# Patient Record
Sex: Female | Born: 1935 | Hispanic: No | Smoking: Former smoker
Health system: Southern US, Community
[De-identification: ages and names within clinical notes are randomized; demographics above are authoritative.]

## PROBLEM LIST (undated history)

## (undated) DIAGNOSIS — H409 Unspecified glaucoma: Secondary | ICD-10-CM

## (undated) DIAGNOSIS — H02402 Unspecified ptosis of left eyelid: Secondary | ICD-10-CM

## (undated) DIAGNOSIS — H04123 Dry eye syndrome of bilateral lacrimal glands: Secondary | ICD-10-CM

## (undated) DIAGNOSIS — I10 Essential (primary) hypertension: Secondary | ICD-10-CM

## (undated) HISTORY — DX: Dry eye syndrome of bilateral lacrimal glands: H04.123

## (undated) HISTORY — DX: Unspecified ptosis of left eyelid: H02.402

## (undated) HISTORY — PX: ABDOMINAL HYSTERECTOMY: SHX81

## (undated) HISTORY — PX: CATARACT EXTRACTION: SUR2

---

## 2010-03-05 ENCOUNTER — Encounter: Admission: RE | Admit: 2010-03-05 | Discharge: 2010-03-05 | Payer: Self-pay | Admitting: Endocrinology

## 2010-04-02 ENCOUNTER — Encounter: Admission: RE | Admit: 2010-04-02 | Discharge: 2010-04-02 | Payer: Self-pay | Admitting: Endocrinology

## 2011-03-19 ENCOUNTER — Ambulatory Visit
Admission: RE | Admit: 2011-03-19 | Discharge: 2011-03-19 | Disposition: A | Discharge status: Home / Self Care | Payer: Medicare Other | Source: Ambulatory Visit | Attending: Endocrinology | Admitting: Endocrinology

## 2011-03-19 ENCOUNTER — Other Ambulatory Visit: Payer: Self-pay | Admitting: Endocrinology

## 2011-03-19 DIAGNOSIS — Z0000 Encounter for general adult medical examination without abnormal findings: Secondary | ICD-10-CM

## 2011-09-16 ENCOUNTER — Ambulatory Visit
Admission: RE | Admit: 2011-09-16 | Discharge: 2011-09-16 | Disposition: A | Discharge status: Home / Self Care | Payer: Medicare Other | Source: Ambulatory Visit | Attending: Endocrinology | Admitting: Endocrinology

## 2011-09-16 ENCOUNTER — Other Ambulatory Visit: Payer: Self-pay | Admitting: Endocrinology

## 2011-09-16 DIAGNOSIS — Z0000 Encounter for general adult medical examination without abnormal findings: Secondary | ICD-10-CM

## 2013-11-19 ENCOUNTER — Emergency Department (INDEPENDENT_AMBULATORY_CARE_PROVIDER_SITE_OTHER)
Admission: EM | Admit: 2013-11-19 | Discharge: 2013-11-19 | Disposition: A | Discharge status: Home / Self Care | Payer: Medicare Other | Source: Home / Self Care | Attending: Family Medicine | Admitting: Family Medicine

## 2013-11-19 ENCOUNTER — Encounter (HOSPITAL_COMMUNITY): Payer: Self-pay | Admitting: Emergency Medicine

## 2013-11-19 DIAGNOSIS — J329 Chronic sinusitis, unspecified: Secondary | ICD-10-CM

## 2013-11-19 DIAGNOSIS — R0982 Postnasal drip: Secondary | ICD-10-CM

## 2013-11-19 LAB — POCT RAPID STREP A: Streptococcus, Group A Screen (Direct): NEGATIVE

## 2013-11-19 MED ORDER — IPRATROPIUM BROMIDE 0.06 % NA SOLN: 2.0000 | Freq: Four times a day (QID) | NASAL | Status: DC

## 2013-11-19 NOTE — ED Notes (Signed)
Pt c/o ST onset 1.5 weeks w/sxs that include a productive cough and fever Denies: v/n/d, SOB, wheezing She is alert w/no signs of acute distress.

## 2013-11-19 NOTE — ED Provider Notes (Signed)
Medical screening examination/treatment/procedure(s) were performed by resident physician or non-physician practitioner and as supervising physician I was immediately available for consultation/collaboration.   Barkley Bruns MD.   Linna Hoff, MD 11/19/13 [phone]

## 2013-11-19 NOTE — ED Provider Notes (Signed)
CSN: [id number]     Arrival date & time 11/19/13  0900 History   First MD Initiated Contact with Patient 11/19/13 0919     Chief Complaint  Patient presents with  . Sore Throat   (Consider location/radiation/quality/duration/timing/severity/associated sxs/prior Treatment) HPI Comments: Patient reports she is recovering from a recent URI, however, she still has some post nasal drainage that is causing some throat irritation and she would like to have this evaluated. Denies fever. States occasionally the drainage makes her cough. Denies dyspnea.   The history is provided by the patient.    History reviewed. No pertinent past medical history. History reviewed. No pertinent past surgical history. No family history on file. History  Substance Use Topics  . Smoking status: Never Smoker   . Smokeless tobacco: Not on file  . Alcohol Use: No   OB History   Grav Para Term Preterm Abortions TAB SAB Ect Mult Living                 Review of Systems  All other systems reviewed and are negative.    Allergies  Review of patient's allergies indicates no known allergies.  Home Medications  No current outpatient prescriptions on file. BP 151/84  Pulse 87  Temp(Src) 97.7 F (36.5 C) (Oral)  Resp 18  SpO2 97% Physical Exam  Nursing note and vitals reviewed. Constitutional: She is oriented to person, place, and time. She appears well-developed and well-nourished. No distress.  HENT:  Head: Normocephalic and atraumatic.  Right Ear: Hearing, tympanic membrane, external ear and ear canal normal.  Left Ear: Hearing, tympanic membrane, external ear and ear canal normal.  Nose: Nose normal.  Mouth/Throat: Uvula is midline, oropharynx is clear and moist and mucous membranes are normal.  Eyes: Conjunctivae are normal. Pupils are equal, round, and reactive to light. Right eye exhibits no discharge. Left eye exhibits no discharge. No scleral icterus.  Neck: Normal range of motion. Neck supple.   Cardiovascular: Normal rate, regular rhythm and normal heart sounds.   Pulmonary/Chest: Effort normal and breath sounds normal. No respiratory distress.  Abdominal: Soft. There is no tenderness.  Musculoskeletal: Normal range of motion.  Lymphadenopathy:    She has no cervical adenopathy.  Neurological: She is alert and oriented to person, place, and time.  Skin: Skin is warm and dry.  Psychiatric: She has a normal mood and affect. Her behavior is normal.    ED Course  Procedures (including critical care time) Labs Review Labs Reviewed - No data to display Imaging Review No results found.  EKG Interpretation    Date/Time:    Ventricular Rate:    PR Interval:    QRS Duration:   QT Interval:    QTC Calculation:   R Axis:     Text Interpretation:              MDM  Benign exam. Will prescribe atrovent nasal spray and advise PCP (Dr. Leslie Dales) follow up if no improvement.     Jess Barters [place], [place] 11/19/13 [phone]

## 2013-11-21 LAB — CULTURE, GROUP A STREP

## 2015-05-05 ENCOUNTER — Emergency Department (INDEPENDENT_AMBULATORY_CARE_PROVIDER_SITE_OTHER)
Admission: EM | Admit: 2015-05-05 | Discharge: 2015-05-05 | Disposition: A | Discharge status: Home / Self Care | Payer: Medicare Other | Source: Home / Self Care | Attending: Family Medicine | Admitting: Family Medicine

## 2015-05-05 ENCOUNTER — Encounter (HOSPITAL_COMMUNITY): Payer: Self-pay | Admitting: Emergency Medicine

## 2015-05-05 DIAGNOSIS — L03113 Cellulitis of right upper limb: Secondary | ICD-10-CM

## 2015-05-05 MED ORDER — LIDOCAINE HCL (PF) 1 % IJ SOLN
INTRAMUSCULAR | Status: AC
  Filled 2015-05-05: qty 5

## 2015-05-05 MED ORDER — CEPHALEXIN 500 MG PO CAPS: 500.0000 mg | ORAL_CAPSULE | Freq: Three times a day (TID) | ORAL | Status: DC

## 2015-05-05 MED ORDER — CEFTRIAXONE SODIUM 1 G IJ SOLR
INTRAMUSCULAR | Status: AC
  Filled 2015-05-05: qty 10

## 2015-05-05 MED ORDER — CEFTRIAXONE SODIUM 1 G IJ SOLR
1.0000 g | Freq: Once | INTRAMUSCULAR | Status: AC
  Administered 2015-05-05: 1 g via INTRAMUSCULAR

## 2015-05-05 NOTE — ED Provider Notes (Signed)
Sheila Spears is a 81 y.o. female who presents to Urgent Care today for cellulitis. Patient received a pneumococcal vaccine on June 7 on her right upper arm. She developed redness and pain the next day. The redness and pain is worsening. She developed a small fever this morning. No vomiting or diarrhea. No treatment tried yet.   History reviewed. No pertinent past medical history. History reviewed. No pertinent past surgical history. History  Substance Use Topics  . Smoking status: Never Smoker   . Smokeless tobacco: Not on file  . Alcohol Use: No   ROS as above Medications: No current facility-administered medications for this encounter.   Current Outpatient Prescriptions  Medication Sig Dispense Refill  . cephALEXin (KEFLEX) 500 MG capsule Take 1 capsule (500 mg total) by mouth 3 (three) times daily. 21 capsule 0  . diltiazem (DILACOR XR) 180 MG 24 hr capsule Take 180 mg by mouth daily.    . ferrous sulfate 325 (65 FE) MG tablet Take 325 mg by mouth daily with breakfast.    . ipratropium (ATROVENT) 0.06 % nasal spray Place 2 sprays into both nostrils 4 (four) times daily. 15 mL 1  . latanoprost (XALATAN) 0.005 % ophthalmic solution 1 drop at bedtime.    Marland Kitchen loratadine (CLARITIN) 10 MG tablet Take 10 mg by mouth daily.    . Multiple Vitamin (MULTIVITAMIN) capsule Take 1 capsule by mouth daily.    . valsartan (DIOVAN) 320 MG tablet Take 320 mg by mouth daily.     No Known Allergies   Exam:  BP 135/66 mmHg  Pulse 97  Temp(Src) 100.4 F (38 C) (Oral)  Resp 16  SpO2 95% Gen: Well NAD HEENT: EOMI,  MMM Lungs: Normal work of breathing. CTABL Heart: RRR no MRG Abd: NABS, Soft. Nondistended, Nontender Exts: Brisk capillary refill, warm and well perfused.  Skin: Large erythematous tender area right upper arm. No significant induration or fluctuance.      No results found for this or any previous visit (from the past 24 hour(s)). No results found.  Assessment and Plan: 81  y.o. female with cellulitis likely due to vaccine. Treat with 1 g ceftriaxone injection in clinic followed by oral Keflex. Prompt follow-up if not improving.  Discussed warning signs or symptoms. Please see discharge instructions. Patient expresses understanding.     Rodolph Bong, MD 05/05/15 [phone]

## 2015-05-05 NOTE — ED Notes (Signed)
C/o right swelling and red States she had a pneumonia shot on Tuesday and notice the swelling Wednesday  Alcohol was used as tx

## 2015-05-05 NOTE — Discharge Instructions (Signed)
Thank you for coming in today. Take Keflex 3 times a day starting tomorrow morning. If worsening or not improving go to the emergency room.  Cellulitis Cellulitis is an infection of the skin and the tissue beneath it. The infected area is usually red and tender. Cellulitis occurs most often in the arms and lower legs.  CAUSES  Cellulitis is caused by bacteria that enter the skin through cracks or cuts in the skin. The most common types of bacteria that cause cellulitis are staphylococci and streptococci. SIGNS AND SYMPTOMS   Redness and warmth.  Swelling.  Tenderness or pain.  Fever. DIAGNOSIS  Your health care provider can usually determine what is wrong based on a physical exam. Blood tests may also be done. TREATMENT  Treatment usually involves taking an antibiotic medicine. HOME CARE INSTRUCTIONS   Take your antibiotic medicine as directed by your health care provider. Finish the antibiotic even if you start to feel better.  Keep the infected arm or leg elevated to reduce swelling.  Apply a warm cloth to the affected area up to 4 times per day to relieve pain.  Take medicines only as directed by your health care provider.  Keep all follow-up visits as directed by your health care provider. SEEK MEDICAL CARE IF:   You notice red streaks coming from the infected area.  Your red area gets larger or turns dark in color.  Your bone or joint underneath the infected area becomes painful after the skin has healed.  Your infection returns in the same area or another area.  You notice a swollen bump in the infected area.  You develop new symptoms.  You have a fever. SEEK IMMEDIATE MEDICAL CARE IF:   You feel very sleepy.  You develop vomiting or diarrhea.  You have a general ill feeling (malaise) with muscle aches and pains. MAKE SURE YOU:   Understand these instructions.  Will watch your condition.  Will get help right away if you are not doing well or get  worse. Document Released: 08/20/2005 Document Revised: 03/27/2014 Document Reviewed: 01/26/2012 [facility] Patient Information 2015 [place], [place]. This information is not intended to replace advice given to you by your health care provider. Make sure you discuss any questions you have with your health care provider.

## 2015-05-07 ENCOUNTER — Telehealth (HOSPITAL_COMMUNITY): Payer: Self-pay | Admitting: Family Medicine

## 2015-05-07 NOTE — ED Notes (Signed)
I called Ms Crewse for check. She is feeling some better. The redness has spread a little. I recommend that she come in for a recheck. She says that she will.   Rodolph Bong, MD 05/07/15 [phone]

## 2015-05-08 ENCOUNTER — Encounter (HOSPITAL_COMMUNITY): Payer: Self-pay | Admitting: Emergency Medicine

## 2015-05-08 ENCOUNTER — Emergency Department (HOSPITAL_COMMUNITY)
Admission: EM | Admit: 2015-05-08 | Discharge: 2015-05-08 | Disposition: A | Discharge status: Nursing Home (Includes ICF) | Payer: Medicare Other | Source: Home / Self Care | Attending: Family Medicine | Admitting: Family Medicine

## 2015-05-08 ENCOUNTER — Inpatient Hospital Stay (HOSPITAL_COMMUNITY)
Admission: EM | Admit: 2015-05-08 | Discharge: 2015-05-10 | DRG: 603 | Disposition: A | Discharge status: Home / Self Care | Payer: Medicare Other | Attending: Internal Medicine | Admitting: Internal Medicine

## 2015-05-08 DIAGNOSIS — Z79899 Other long term (current) drug therapy: Secondary | ICD-10-CM | POA: Diagnosis not present

## 2015-05-08 DIAGNOSIS — L0390 Cellulitis, unspecified: Secondary | ICD-10-CM | POA: Diagnosis present

## 2015-05-08 DIAGNOSIS — D696 Thrombocytopenia, unspecified: Secondary | ICD-10-CM | POA: Diagnosis present

## 2015-05-08 DIAGNOSIS — I10 Essential (primary) hypertension: Secondary | ICD-10-CM | POA: Diagnosis present

## 2015-05-08 DIAGNOSIS — T50Z95A Adverse effect of other vaccines and biological substances, initial encounter: Secondary | ICD-10-CM | POA: Diagnosis present

## 2015-05-08 DIAGNOSIS — Z66 Do not resuscitate: Secondary | ICD-10-CM | POA: Diagnosis present

## 2015-05-08 DIAGNOSIS — L03113 Cellulitis of right upper limb: Secondary | ICD-10-CM

## 2015-05-08 DIAGNOSIS — L958 Other vasculitis limited to the skin: Secondary | ICD-10-CM | POA: Diagnosis present

## 2015-05-08 DIAGNOSIS — Z881 Allergy status to other antibiotic agents status: Secondary | ICD-10-CM

## 2015-05-08 DIAGNOSIS — L538 Other specified erythematous conditions: Secondary | ICD-10-CM | POA: Diagnosis present

## 2015-05-08 DIAGNOSIS — M542 Cervicalgia: Secondary | ICD-10-CM | POA: Diagnosis present

## 2015-05-08 DIAGNOSIS — T361X5A Adverse effect of cephalosporins and other beta-lactam antibiotics, initial encounter: Secondary | ICD-10-CM | POA: Diagnosis present

## 2015-05-08 DIAGNOSIS — H409 Unspecified glaucoma: Secondary | ICD-10-CM | POA: Diagnosis present

## 2015-05-08 DIAGNOSIS — G8929 Other chronic pain: Secondary | ICD-10-CM | POA: Diagnosis present

## 2015-05-08 DIAGNOSIS — R233 Spontaneous ecchymoses: Secondary | ICD-10-CM

## 2015-05-08 HISTORY — DX: Unspecified glaucoma: H40.9

## 2015-05-08 HISTORY — DX: Essential (primary) hypertension: I10

## 2015-05-08 LAB — BASIC METABOLIC PANEL
Anion gap: 10 (ref 5–15)
BUN: 18 mg/dL (ref 6–20)
CALCIUM: 9.6 mg/dL (ref 8.9–10.3)
CO2: 24 mmol/L (ref 22–32)
CREATININE: 0.79 mg/dL (ref 0.44–1.00)
Chloride: 102 mmol/L (ref 101–111)
Glucose, Bld: 122 mg/dL — ABNORMAL HIGH (ref 65–99)
POTASSIUM: 4.4 mmol/L (ref 3.5–5.1)
Sodium: 136 mmol/L (ref 135–145)

## 2015-05-08 LAB — CBC WITH DIFFERENTIAL/PLATELET
BASOS ABS: 0 10*3/uL (ref 0.0–0.1)
Basophils Relative: 0 % (ref 0–1)
EOS ABS: 0.1 10*3/uL (ref 0.0–0.7)
Eosinophils Relative: 1 % (ref 0–5)
HCT: 35 % — ABNORMAL LOW (ref 36.0–46.0)
Hemoglobin: 11.8 g/dL — ABNORMAL LOW (ref 12.0–15.0)
LYMPHS PCT: 36 % (ref 12–46)
Lymphs Abs: 2.3 10*3/uL (ref 0.7–4.0)
MCH: 28.7 pg (ref 26.0–34.0)
MCHC: 33.7 g/dL (ref 30.0–36.0)
MCV: 85.2 fL (ref 78.0–100.0)
MONO ABS: 0.4 10*3/uL (ref 0.1–1.0)
Monocytes Relative: 6 % (ref 3–12)
NEUTROS PCT: 57 % (ref 43–77)
Neutro Abs: 3.7 10*3/uL (ref 1.7–7.7)
Platelets: 144 10*3/uL — ABNORMAL LOW (ref 150–400)
RBC: 4.11 MIL/uL (ref 3.87–5.11)
RDW: 13.6 % (ref 11.5–15.5)
WBC: 6.5 10*3/uL (ref 4.0–10.5)

## 2015-05-08 LAB — URINALYSIS W MICROSCOPIC (NOT AT ARMC)
Bilirubin Urine: NEGATIVE
GLUCOSE, UA: NEGATIVE mg/dL
Hgb urine dipstick: NEGATIVE
Ketones, ur: NEGATIVE mg/dL
LEUKOCYTES UA: NEGATIVE
Nitrite: NEGATIVE
PROTEIN: NEGATIVE mg/dL
Specific Gravity, Urine: 1.012 (ref 1.005–1.030)
Urobilinogen, UA: 1 mg/dL (ref 0.0–1.0)
pH: 7.5 (ref 5.0–8.0)

## 2015-05-08 LAB — C-REACTIVE PROTEIN: CRP: 16.7 mg/dL — ABNORMAL HIGH (ref <1.0)

## 2015-05-08 LAB — TYPE AND SCREEN
ABO/RH(D): B POS
ANTIBODY SCREEN: NEGATIVE

## 2015-05-08 LAB — SEDIMENTATION RATE: Sed Rate: 89 mm/hr — ABNORMAL HIGH (ref 0–22)

## 2015-05-08 LAB — APTT: APTT: 30 s (ref 24–37)

## 2015-05-08 LAB — CK: CK TOTAL: 75 U/L (ref 38–234)

## 2015-05-08 LAB — I-STAT CG4 LACTIC ACID, ED
Lactic Acid, Venous: 1.3 mmol/L (ref 0.5–2.0)
Lactic Acid, Venous: 1.17 mmol/L (ref 0.5–2.0)

## 2015-05-08 LAB — LACTIC ACID, PLASMA: Lactic Acid, Venous: 1.8 mmol/L (ref 0.5–2.0)

## 2015-05-08 LAB — LACTATE DEHYDROGENASE: LDH: 202 U/L — ABNORMAL HIGH (ref 98–192)

## 2015-05-08 LAB — PROTIME-INR
INR: 1.18 (ref 0.00–1.49)
PROTHROMBIN TIME: 15.2 s (ref 11.6–15.2)

## 2015-05-08 LAB — FIBRINOGEN: FIBRINOGEN: 759 mg/dL — AB (ref 204–475)

## 2015-05-08 LAB — PROCALCITONIN

## 2015-05-08 MED ORDER — PIPERACILLIN-TAZOBACTAM 3.375 G IVPB 30 MIN: 3.3750 g | Freq: Once | INTRAVENOUS | Status: DC

## 2015-05-08 MED ORDER — VANCOMYCIN HCL 10 G IV SOLR
1250.0000 mg | INTRAVENOUS | Status: DC
  Filled 2015-05-08: qty 1250

## 2015-05-08 MED ORDER — SODIUM CHLORIDE 0.9 % IV SOLN
INTRAVENOUS | Status: DC
  Administered 2015-05-09: via INTRAVENOUS

## 2015-05-08 MED ORDER — ONDANSETRON HCL 4 MG PO TABS: 4.0000 mg | ORAL_TABLET | Freq: Four times a day (QID) | ORAL | Status: DC | PRN

## 2015-05-08 MED ORDER — VANCOMYCIN HCL 10 G IV SOLR
1250.0000 mg | INTRAVENOUS | Status: DC
  Administered 2015-05-09: 1250 mg via INTRAVENOUS
  Filled 2015-05-08 (×2): qty 1250

## 2015-05-08 MED ORDER — ACETAMINOPHEN 325 MG PO TABS: 650.0000 mg | ORAL_TABLET | Freq: Four times a day (QID) | ORAL | Status: DC | PRN

## 2015-05-08 MED ORDER — ONDANSETRON HCL 4 MG/2ML IJ SOLN: 4.0000 mg | Freq: Three times a day (TID) | INTRAMUSCULAR | Status: DC | PRN

## 2015-05-08 MED ORDER — FERROUS SULFATE 325 (65 FE) MG PO TABS
325.0000 mg | ORAL_TABLET | ORAL | Status: DC | PRN
  Filled 2015-05-08: qty 1

## 2015-05-08 MED ORDER — IRBESARTAN 300 MG PO TABS
300.0000 mg | ORAL_TABLET | Freq: Every day | ORAL | Status: DC
  Administered 2015-05-09 – 2015-05-10 (×2): 300 mg via ORAL
  Filled 2015-05-08 (×2): qty 1

## 2015-05-08 MED ORDER — ACETAMINOPHEN 650 MG RE SUPP
650.0000 mg | Freq: Four times a day (QID) | RECTAL | Status: DC | PRN
Start: 2015-05-08 — End: 2015-05-10

## 2015-05-08 MED ORDER — PIPERACILLIN-TAZOBACTAM 3.375 G IVPB: 3.3750 g | Freq: Three times a day (TID) | INTRAVENOUS | Status: DC

## 2015-05-08 MED ORDER — DILTIAZEM HCL ER 180 MG PO CP24
180.0000 mg | ORAL_CAPSULE | Freq: Every day | ORAL | Status: DC
  Administered 2015-05-09 – 2015-05-10 (×2): 180 mg via ORAL
  Filled 2015-05-08 (×2): qty 1

## 2015-05-08 MED ORDER — ONDANSETRON HCL 4 MG/2ML IJ SOLN: 4.0000 mg | Freq: Four times a day (QID) | INTRAMUSCULAR | Status: DC | PRN

## 2015-05-08 MED ORDER — VANCOMYCIN HCL 10 G IV SOLR
1250.0000 mg | Freq: Once | INTRAVENOUS | Status: AC
  Administered 2015-05-08: 1250 mg via INTRAVENOUS
  Filled 2015-05-08: qty 1250

## 2015-05-08 NOTE — ED Notes (Signed)
Dr. Allena Katz at bedside explaining to family reason for pt being admitted.

## 2015-05-08 NOTE — ED Notes (Signed)
Pt. Stated, I had a pneumonia shot last week and My arm is red and painful sometimes for a week.

## 2015-05-08 NOTE — Progress Notes (Signed)
Received report

## 2015-05-08 NOTE — ED Notes (Signed)
Here for a f/u of right arm; seen here on 6/11 Reports swelling and redness has spread and has now developed a rash on bilateral legs Denies SOB, dyspnea Alert, no signs of acute distress.

## 2015-05-08 NOTE — Progress Notes (Signed)
ANTIBIOTIC CONSULT NOTE - INITIAL  Pharmacy Consult for vancomycin, zosyn Indication: cellulitis  No Known Allergies  Patient Measurements: Height: 5\' 3"  (160 cm) Weight: 145 lb (65.772 kg) IBW/kg (Calculated) : 52.4 Adjusted Body Weight:   Vital Signs: Temp: 98 F (36.7 C) (06/14 1417) Temp Source: Oral (06/14 1316) BP: 132/64 mmHg (06/14 1730) Pulse Rate: 66 (06/14 1730) Intake/Output from previous day:   Intake/Output from this shift:    Labs:  Recent Labs  05/08/15 1648  WBC 6.5  HGB 11.8*  PLT 144*  CREATININE 0.79   Estimated Creatinine Clearance: 42.6 mL/min (by C-G formula based on Cr of 0.79). No results for input(s): VANCOTROUGH, VANCOPEAK, VANCORANDOM, GENTTROUGH, GENTPEAK, GENTRANDOM, TOBRATROUGH, TOBRAPEAK, TOBRARND, AMIKACINPEAK, AMIKACINTROU, AMIKACIN in the last 72 hours.   Microbiology: No results found for this or any previous visit (from the past 720 hour(s)).  Medical History: Past Medical History  Diagnosis Date  . Hypertension     Medications:  See EMR  Assessment: 81 yo female admitted with cellulitis on bilateral lower extremities. Pt was started on Keflex 3 days ago with no improvement in symptoms. WBC wnl, afebrile.   Goal of Therapy:  Vancomycin trough level 10-15 mcg/ml   Plan:  Vancomycin 1250 mg IV q24h Zosyn 3.375 g IV q8h Monitor renal fx, cultures, VT as needed    Agapito Games, PharmD, BCPS Clinical Pharmacist Pager: [phone] 05/08/2015 7:00 PM

## 2015-05-08 NOTE — ED Provider Notes (Signed)
Sheila Spears is a 81 y.o. female who presents to Urgent Care today for cellulitis. Patient was seen on the 11th after developing cellulitis following an injection of pneumococcal vaccine in her right arm. She was given a ceftriaxone injection and started on oral Keflex. She notes that the pain and fever in her arm has slightly diminished however the redness has extended distally down her arm. She notes continued tenderness now especially at the medial elbow on the right. Additionally last night she developed a rash on her bilateral lower extremities. No fevers or chills vomiting or diarrhea. No shortness of breath.   History reviewed. No pertinent past medical history. History reviewed. No pertinent past surgical history. History  Substance Use Topics  . Smoking status: Never Smoker   . Smokeless tobacco: Not on file  . Alcohol Use: No   ROS as above Medications: No current facility-administered medications for this encounter.   Current Outpatient Prescriptions  Medication Sig Dispense Refill  . cephALEXin (KEFLEX) 500 MG capsule Take 1 capsule (500 mg total) by mouth 3 (three) times daily. 21 capsule 0  . diltiazem (DILACOR XR) 180 MG 24 hr capsule Take 180 mg by mouth daily.    . ferrous sulfate 325 (65 FE) MG tablet Take 325 mg by mouth daily with breakfast.    . loratadine (CLARITIN) 10 MG tablet Take 10 mg by mouth daily.    . Multiple Vitamin (MULTIVITAMIN) capsule Take 1 capsule by mouth daily.    . valsartan (DIOVAN) 320 MG tablet Take 320 mg by mouth daily.    Marland Kitchen ipratropium (ATROVENT) 0.06 % nasal spray Place 2 sprays into both nostrils 4 (four) times daily. 15 mL 1  . latanoprost (XALATAN) 0.005 % ophthalmic solution 1 drop at bedtime.     No Known Allergies   Exam:  BP 128/59 mmHg  Pulse 99  Temp(Src) 98.3 F (36.8 C) (Oral)  Resp 16  SpO2 98% Gen: Well NAD HEENT: EOMI,  MMM Skin: Erythema extending distally beyond the borders of the original cellulitis. The  proximal portion of the cellulitis is resolving. She is tender without fluctuance at the medial elbow. Additionally she has a  petechial rash on her bilateral lower extremities. Nonblanching. Nontender. Exts: Brisk capillary refill, warm and well perfused.   No results found for this or any previous visit (from the past 24 hour(s)). No results found.  Assessment and Plan: 81 y.o. female with  1) cellulitis: Worsening despite adequate oral antibody. Transfer to the emergency department for further evaluation and management. 2) petechial rash: Concerning for DIC or thrombocytopenia. Transfer to the emergency room for further evaluation and management.  Discussed warning signs or symptoms. Please see discharge instructions. Patient expresses understanding.     Rodolph Bong, MD 05/08/15 [phone]

## 2015-05-08 NOTE — ED Provider Notes (Signed)
CSN: [id number]     Arrival date & time 05/08/15  1413 History   First MD Initiated Contact with Patient 05/08/15 1552     Chief Complaint  Patient presents with  . Allergic Reaction      HPI 81 year old female was sent down from the urgent care for evaluation of possible cellulitis allergic reaction.  Patient initially had a pneumococcal vaccine injection to her right arm and then developed redness and pain at the injection site.  She went to urgent care and received some ceftriaxone placed on Keflex.  Today she has worsening extending redness and also petechiae on her lower extremities. Past Medical History  Diagnosis Date  . Hypertension   . Glaucoma    History reviewed. No pertinent past surgical history. Family History  Problem Relation Age of Onset  . Cancer - Colon Mother    History  Substance Use Topics  . Smoking status: Never Smoker   . Smokeless tobacco: Not on file  . Alcohol Use: No   OB History    No data available     Review of Systems    Allergies  Keflex  Home Medications   Prior to Admission medications   Medication Sig Start Date End Date Taking? Authorizing Provider  diltiazem (DILACOR XR) 180 MG 24 hr capsule Take 180 mg by mouth daily.   Yes Historical Provider, MD  ferrous sulfate 325 (65 FE) MG tablet Take 325 mg by mouth as needed (ONLY TAKES WHEN NEEDED, AD DIRECTED BY MD).    Yes Historical Provider, MD  latanoprost (XALATAN) 0.005 % ophthalmic solution Place 1 drop into both eyes at bedtime.    Yes Historical Provider, MD  Multiple Vitamin (MULTIVITAMIN) capsule Take 1 capsule by mouth as needed (ONLY TAKES SOMETIMES).    Yes Historical Provider, MD  Multiple Vitamins-Minerals (PRESERVISION AREDS) CAPS Take 1 capsule by mouth daily.   Yes Historical Provider, MD  Omega-3 Fatty Acids (FISH OIL PO) Take 1 capsule by mouth at bedtime.   Yes Historical Provider, MD  valsartan (DIOVAN) 320 MG tablet Take 320 mg by mouth daily.   Yes Historical  Provider, MD  doxycycline (VIBRA-TABS) 100 MG tablet Take 1 tablet (100 mg total) by mouth every 12 (twelve) hours. Starting tonight for 8 days. 05/10/15   Osvaldo Shipper, MD  ipratropium (ATROVENT) 0.06 % nasal spray Place 2 sprays into both nostrils 4 (four) times daily. 11/19/13   Jess Barters H Presson, PA   BP 142/65 mmHg  Pulse 72  Temp(Src) 98.5 F (36.9 C) (Oral)  Resp 16  Ht  (1.6 m)  Wt 144 lb 10 oz (65.6 kg)  BMI 25.63 kg/m2  SpO2 100% Physical Exam  Constitutional: She is oriented to person, place, and time. She appears well-developed and well-nourished. No distress.  HENT:  Head: Normocephalic and atraumatic.  Eyes: Pupils are equal, round, and reactive to light.  Neck: Normal range of motion.  Cardiovascular: Normal rate and intact distal pulses.   Pulmonary/Chest: No respiratory distress.  Abdominal: Normal appearance. She exhibits no distension.  Musculoskeletal: Normal range of motion. She exhibits tenderness.       Arms: Area of cellulitis to right arm  Neurological: She is alert and oriented to person, place, and time. No cranial nerve deficit.  Skin: Skin is warm and dry. No rash noted.  Psychiatric: She has a normal mood and affect. Her behavior is normal.  Nursing note and vitals reviewed.   ED Course  Procedures (including critical  care time) Medications  vancomycin (VANCOCIN) 1,250 mg in sodium chloride 0.9 % 250 mL IVPB (1,250 mg Intravenous Given 05/08/15 2058)    Labs Review Labs Reviewed  CBC WITH DIFFERENTIAL/PLATELET - Abnormal; Notable for the following:    Hemoglobin 11.8 (*)    HCT 35.0 (*)    Platelets 144 (*)    All other components within normal limits  BASIC METABOLIC PANEL - Abnormal; Notable for the following:    Glucose, Bld 122 (*)    All other components within normal limits  FIBRINOGEN - Abnormal; Notable for the following:    Fibrinogen 759 (*)    All other components within normal limits  SEDIMENTATION RATE - Abnormal;  Notable for the following:    Sed Rate 89 (*)    All other components within normal limits  C-REACTIVE PROTEIN - Abnormal; Notable for the following:    CRP 16.7 (*)    All other components within normal limits  LACTATE DEHYDROGENASE - Abnormal; Notable for the following:    LDH 202 (*)    All other components within normal limits  URINALYSIS W MICROSCOPIC - Abnormal; Notable for the following:    APPearance CLOUDY (*)    Squamous Epithelial / LPF FEW (*)    All other components within normal limits  CBC WITH DIFFERENTIAL/PLATELET - Abnormal; Notable for the following:    Hemoglobin 10.8 (*)    HCT 32.6 (*)    All other components within normal limits  COMPREHENSIVE METABOLIC PANEL - Abnormal; Notable for the following:    Glucose, Bld 114 (*)    Calcium 8.6 (*)    Total Protein 6.0 (*)    Albumin 2.4 (*)    All other components within normal limits  C4 COMPLEMENT - Abnormal; Notable for the following:    Complement C4, Body Fluid 5 (*)    All other components within normal limits  CBC - Abnormal; Notable for the following:    Hemoglobin 10.8 (*)    HCT 33.2 (*)    All other components within normal limits  BASIC METABOLIC PANEL - Abnormal; Notable for the following:    Glucose, Bld 122 (*)    Calcium 8.6 (*)    GFR calc non Af Amer 58 (*)    All other components within normal limits  CULTURE, BLOOD (ROUTINE X 2)  CULTURE, BLOOD (ROUTINE X 2)  LACTIC ACID, PLASMA  PROCALCITONIN  PROTIME-INR  APTT  CK  PROTIME-INR  C3 COMPLEMENT  ANTINUCLEAR ANTIBODIES, IFA  I-STAT CG4 LACTIC ACID, ED  I-STAT CG4 LACTIC ACID, ED  TYPE AND SCREEN  ABO/RH    Imaging Review No results found.  Patient was admitted for IV antibotics  MDM   Final diagnoses:  Cellulitis of right upper extremity  Petechial rash        Nelva Nay, MD 05/12/15 [phone]

## 2015-05-08 NOTE — Progress Notes (Signed)
Attempted report. Ed RN will return call.

## 2015-05-08 NOTE — ED Notes (Signed)
Family at bedside. 

## 2015-05-09 ENCOUNTER — Encounter (HOSPITAL_COMMUNITY): Payer: Self-pay | Admitting: *Deleted

## 2015-05-09 DIAGNOSIS — L03113 Cellulitis of right upper limb: Principal | ICD-10-CM

## 2015-05-09 DIAGNOSIS — I10 Essential (primary) hypertension: Secondary | ICD-10-CM

## 2015-05-09 DIAGNOSIS — R233 Spontaneous ecchymoses: Secondary | ICD-10-CM

## 2015-05-09 LAB — CBC WITH DIFFERENTIAL/PLATELET
BASOS PCT: 1 % (ref 0–1)
Basophils Absolute: 0 10*3/uL (ref 0.0–0.1)
EOS PCT: 2 % (ref 0–5)
Eosinophils Absolute: 0.1 10*3/uL (ref 0.0–0.7)
HCT: 32.6 % — ABNORMAL LOW (ref 36.0–46.0)
HEMOGLOBIN: 10.8 g/dL — AB (ref 12.0–15.0)
LYMPHS ABS: 1.9 10*3/uL (ref 0.7–4.0)
Lymphocytes Relative: 40 % (ref 12–46)
MCH: 27.8 pg (ref 26.0–34.0)
MCHC: 33.1 g/dL (ref 30.0–36.0)
MCV: 83.8 fL (ref 78.0–100.0)
MONOS PCT: 10 % (ref 3–12)
Monocytes Absolute: 0.5 10*3/uL (ref 0.1–1.0)
Neutro Abs: 2.3 10*3/uL (ref 1.7–7.7)
Neutrophils Relative %: 47 % (ref 43–77)
PLATELETS: 162 10*3/uL (ref 150–400)
RBC: 3.89 MIL/uL (ref 3.87–5.11)
RDW: 13.7 % (ref 11.5–15.5)
WBC: 4.8 10*3/uL (ref 4.0–10.5)

## 2015-05-09 LAB — COMPREHENSIVE METABOLIC PANEL
ALT: 20 U/L (ref 14–54)
AST: 17 U/L (ref 15–41)
Albumin: 2.4 g/dL — ABNORMAL LOW (ref 3.5–5.0)
Alkaline Phosphatase: 67 U/L (ref 38–126)
Anion gap: 8 (ref 5–15)
BUN: 12 mg/dL (ref 6–20)
CALCIUM: 8.6 mg/dL — AB (ref 8.9–10.3)
CHLORIDE: 105 mmol/L (ref 101–111)
CO2: 26 mmol/L (ref 22–32)
Creatinine, Ser: 0.73 mg/dL (ref 0.44–1.00)
GFR calc non Af Amer: >60 mL/min (ref >60)
GLUCOSE: 114 mg/dL — AB (ref 65–99)
Potassium: 4.1 mmol/L (ref 3.5–5.1)
Sodium: 139 mmol/L (ref 135–145)
TOTAL PROTEIN: 6 g/dL — AB (ref 6.5–8.1)
Total Bilirubin: 0.7 mg/dL (ref 0.3–1.2)

## 2015-05-09 LAB — PROTIME-INR
INR: 1.16 (ref 0.00–1.49)
Prothrombin Time: 15 seconds (ref 11.6–15.2)

## 2015-05-09 LAB — ABO/RH: ABO/RH(D): B POS

## 2015-05-09 MED ORDER — DIPHENHYDRAMINE HCL 25 MG PO CAPS
25.0000 mg | ORAL_CAPSULE | Freq: Three times a day (TID) | ORAL | Status: DC | PRN
Start: 2015-05-09 — End: 2015-05-10

## 2015-05-09 MED ORDER — FAMOTIDINE IN NACL 20-0.9 MG/50ML-% IV SOLN
20.0000 mg | Freq: Two times a day (BID) | INTRAVENOUS | Status: DC
Start: 2015-05-09 — End: 2015-05-10
  Administered 2015-05-09 – 2015-05-10 (×4): 20 mg via INTRAVENOUS
  Filled 2015-05-09 (×5): qty 50

## 2015-05-09 NOTE — Progress Notes (Signed)
Utilization review completed.  

## 2015-05-09 NOTE — Progress Notes (Signed)
[facility] PROGRESS NOTE  Sheila Spears WUJ:[id number] DOB: 13-Oct-1922 DOA: 05/08/2015  PCP: Junious Silk, MD  Brief HPI: 81 year old African-American female with a past medical history of hypertension, was brought in with chief complaint of skin rash. Patient had received a pneumococcal vaccination on her right. A few days ago. She subsequently noticed redness involving that arm. She was treated with oral Keflex for presumed cellulitis. She was given an injection of ceftriaxone prior to that. She presented due to worsening of this rash with development of a new rash in her legs. She was subsequently admitted for further management.  Past medical history:  Past Medical History  Diagnosis Date  . Hypertension   . Glaucoma     Consultants: None  Procedures: None  Antibiotics: IV vancomycin  Subjective: Patient feels better this morning. The right arm is much improved and not as red as before. The rash in the legs are about the same.  Objective: Vital Signs  Filed Vitals:   05/08/15 1900 05/08/15 1934 05/08/15 2048 05/09/15 0549  BP: 143/54 160/64 165/72 125/48  Pulse: 73 82 90 89  Temp:   98.1 F (36.7 C) 98.1 F (36.7 C)  TempSrc:   Oral Oral  Resp:  Height:    (1.6 m)   Weight:   65.6 kg (144 lb 10 oz)   SpO2: 99% 100% 96% 97%    Intake/Output Summary (Last 24 hours) at 05/09/15 0958 Last data filed at 05/09/15 0914  Gross per 24 hour  Intake    560 ml  Output   1050 ml  Net   -490 ml   Filed Weights   05/08/15 1417 05/08/15 2048  Weight: 65.772 kg (145 lb) 65.6 kg (144 lb 10 oz)    General appearance: alert, cooperative, appears stated age and no distress Resp: clear to auscultation bilaterally Cardio: regular rate and rhythm, S1, S2 normal, no murmur, click, rub or gallop GI: soft, non-tender; bowel sounds normal; no masses,  no organomegaly Extremities: Right upper arm remains warm to touch. Erythema is present but much  better, according to the patient. Area of erythema is also decreased in size based on the markings. Skin: Petechial rash noted in both legs. Perhaps slightly more so on the right leg compared to the left. Neurologic: No focal deficits.  Lab Results:  Basic Metabolic Panel:  Recent Labs Lab 05/08/15 1648 05/09/15 0614  NA 136 139  K 4.4 4.1  CL 102 105  CO2 24 26  GLUCOSE 122* 114*  BUN 18 12  CREATININE 0.79 0.73  CALCIUM 9.6 8.6*   Liver Function Tests:  Recent Labs Lab 05/09/15 0614  AST 17  ALT 20  ALKPHOS 67  BILITOT 0.7  PROT 6.0*  ALBUMIN 2.4*   CBC:  Recent Labs Lab 05/08/15 1648 05/09/15 0614  WBC 6.5 4.8  NEUTROABS 3.7 2.3  HGB 11.8* 10.8*  HCT 35.0* 32.6*  MCV 85.2 83.8  PLT 144* 162   Cardiac Enzymes:  Recent Labs Lab 05/08/15 1927  CKTOTAL 75    Studies/Results: No results found.  Medications:  Scheduled: . diltiazem  180 mg Oral Daily  . famotidine (PEPCID) IV  20 mg Intravenous Q12H  . irbesartan  300 mg Oral Daily  . vancomycin  1,250 mg Intravenous Q24H   Continuous: . sodium chloride 10 mL/hr at 05/09/15 1034   NFA:[id number] **OR** acetaminophen, diphenhydrAMINE, ferrous sulfate, ondansetron **OR** ondansetron (ZOFRAN) IV  Assessment/Plan:  Principal Problem:  Cellulitis Active Problems:   Essential hypertension   Macular erythematous rash, nonblanching    Cellulitis of the right upper extremity Likely secondary to the recent vaccination. Cellulitis appears to be improving with vancomycin, which will be continued.  Bilateral lower extremity petechial rash Could be secondary to a drug reaction. She was on Keflex which has been discontinued. This rash has not improved, but does not appear to be any worse. Possibility also remains for leukocytoclastic vasculitis. Continue to monitor for now. If the rash doesn't resolve in a few weeks' time she may need to be seen by a dermatologist. Continue  antihistaminic.  History of essential hypertension Stable. Continue home medications.  Mild Thrombocytopenia Stable. Counts are actually normal this morning.  DVT Prophylaxis: SCDs    Code Status: DO NOT RESUSCITATE  Family Communication: Discussed with patient  Disposition Plan: PT and OT to evaluate. Patient lives alone but is highly functional. She'll likely return home when improved.  Follow-up Appointment?: With her PCP in 1 to 2 weeks after discharge.   LOS: 1 day   [facility]  [facility] Pager [phone] 05/09/2015, 9:58 AM  If 7PM-7AM, please contact night-coverage at [email or web address], password [facility]

## 2015-05-09 NOTE — Progress Notes (Signed)
Patient admitted to unit via stretcher. Skin intact, IV intact. Redness and swelling noted to right upper arm - area marked. And petechiae noted to bilateral lower extremities. Pt alert and oriented x4. Call bell within reach, bed locked and in lowest position. Awaiting MD orders.

## 2015-05-09 NOTE — H&P (Signed)
[facility] History and Physical  Patient: Sheila Spears  MRN: [id number]  DOB: 1921/12/02  DOS: the patient was seen and examined on 05/08/2015 PCP: Limmie Patricia, MD  Referring physician: Dr. Audie Pinto Chief Complaint: Skin rash  HPI: Sheila Spears is a 81 y.o. female with Past medical history of hypertension. The patient was brought in because of skin rash. Patient had a pneumococcal vaccine on her right arm in deltoid. After that she started developing some pain at point tenderness at the injection site. Later on 2 days later she noticed redness involving the area and also had some flulike symptoms with some less fatigue and malaise. She was seen in urgent care and she was given ceftriaxone injection and was discharged on oral Keflex. She has taken oral Keflex for at least one week and was seen for follow-up today. She mentions 2 days ago she has noted some red spots on her leg. She mentions prior to her arrival to the ER here they were below her knee. She denies any diarrhea, vomiting, active bleeding, burning urination, blood in the urine, cough, abdominal pain, dizziness, lightheadedness, fever, chills. She complains of some neck pain which is chronic for her. She denies any fall trauma or injury. She denies being on Keflex in the past.  The patient is coming from home. And at her baseline independent for most of her ADL.  Review of Systems: as mentioned in the history of present illness.  A comprehensive review of the other systems is negative.  Past Medical History  Diagnosis Date  . Hypertension    History reviewed. No pertinent past surgical history. Social History:  reports that she has never smoked. She does not have any smokeless tobacco history on file. She reports that she does not drink alcohol or use illicit drugs.  No Known Allergies  Family History  Problem Relation Age of Onset  . Cancer - Colon Mother     Prior to Admission medications     Medication Sig Start Date End Date Taking? Authorizing Provider  cephALEXin (KEFLEX) 500 MG capsule Take 1 capsule (500 mg total) by mouth 3 (three) times daily. Patient taking differently: Take 500 mg by mouth 3 (three) times daily. STARTED 05/05/15, FOR 7 DAYS ENDING 05/11/15 05/05/15  Yes Gregor Hams, MD  diltiazem (DILACOR XR) 180 MG 24 hr capsule Take 180 mg by mouth daily.   Yes Historical Provider, MD  ferrous sulfate 325 (65 FE) MG tablet Take 325 mg by mouth as needed (ONLY TAKES WHEN NEEDED, AD DIRECTED BY MD).    Yes Historical Provider, MD  latanoprost (XALATAN) 0.005 % ophthalmic solution Place 1 drop into both eyes at bedtime.    Yes Historical Provider, MD  Multiple Vitamin (MULTIVITAMIN) capsule Take 1 capsule by mouth as needed (ONLY TAKES SOMETIMES).    Yes Historical Provider, MD  Multiple Vitamins-Minerals (PRESERVISION AREDS) CAPS Take 1 capsule by mouth daily.   Yes Historical Provider, MD  Omega-3 Fatty Acids (FISH OIL PO) Take 1 capsule by mouth at bedtime.   Yes Historical Provider, MD  valsartan (DIOVAN) 320 MG tablet Take 320 mg by mouth daily.   Yes Historical Provider, MD  ipratropium (ATROVENT) 0.06 % nasal spray Place 2 sprays into both nostrils 4 (four) times daily. 11/19/13   Lutricia Feil, PA    Physical Exam: Filed Vitals:   05/08/15 1845 05/08/15 1900 05/08/15 1934 05/08/15 2048  BP: 100/70 143/54 160/64 165/72  Pulse: 73 73 82 90  Temp:    98.1 F (36.7 C)  TempSrc:    Oral  Resp:   24 18  Height:    '5\' 3"'$  (1.6 m)  Weight:    65.6 kg (144 lb 10 oz)  SpO2: 100% 99% 100% 96%    General: Alert, Awake and Oriented to Time, Place and Person. Appear in mild distress Eyes: PERRL ENT: Oral Mucosa clear moist. Neck: no JVD Cardiovascular: S1 and S2 Present, no Murmur, Peripheral Pulses Present Respiratory: Bilateral Air entry equal and Decreased,  Clear to Auscultation, no Crackles, no wheezes Abdomen: Bowel Sound present, Soft and non  tender Skin: Diffuse macular nonblanching petechia involving the leg up to thigh bilaterally. Non-itching.  Extremities: no Pedal edema, no calf tenderness Neurologic: Grossly no focal neuro deficit.  Labs on Admission:  CBC:  Recent Labs Lab 05/08/15 1648  WBC 6.5  NEUTROABS 3.7  HGB 11.8*  HCT 35.0*  MCV 85.2  PLT 144*    CMP     Component Value Date/Time   NA 136 05/08/2015 1648   K 4.4 05/08/2015 1648   CL 102 05/08/2015 1648   CO2 24 05/08/2015 1648   GLUCOSE 122* 05/08/2015 1648   BUN 18 05/08/2015 1648   CREATININE 0.79 05/08/2015 1648   CALCIUM 9.6 05/08/2015 1648   GFRNONAA >60 05/08/2015 1648   GFRAA >60 05/08/2015 1648    No results for input(s): LIPASE, AMYLASE in the last 168 hours.   Recent Labs Lab 05/08/15 1927  CKTOTAL 75   BNP (last 3 results) No results for input(s): BNP in the last 8760 hours.  ProBNP (last 3 results) No results for input(s): PROBNP in the last 8760 hours.   Radiological Exams on Admission: No results found. Assessment/Plan Principal Problem:   Cellulitis Active Problems:   Essential hypertension   Macular erythematous rash, nonblanching   1. Cellulitis The patient is presenting with right upper extremity cellulitis which is not improving with home oral antibiotics. It is spreading beyond the margins marked on 05/05/2015. With this patient will be admitted in the hospital. As is the cellulitis is concerning is spreading and therefore I would treat her with vancomycin. We will obtain blood culture ESR CRP as well. At present I do not see any evidence of compartment syndrome. Continue close monitoring.  2. Bilateral lower extremity petechia. The patient is presenting with nonblanching non-itching, macular diffuse petechial rash. Probable etiology is drug reaction also possibility of suspected systemic infection or systemic inflammatory reaction cannot be ruled out. Stop Keflex. At present I would use Pepcid  Benadryl. She does not have any itching therefore we will hold off on using any topical creams. Monitor for rash improvement after stopping Lisinopril does not improve then will require biopsy with dermatology. Possible leukocytoclastic vasculitis also cannot be ruled out in the presence of cellulitis as well as allergic reaction with Keflex. Does not appear to have any meningismus symptoms on exam.  3. History of hypertension. Continuing home medications at present.  4. Thrombocytopenia. Patient mentions she has chronically low platelets. Currently platelets are 144. Continue close monitoring  Advance goals of care discussion: DNR/DNI as per my discussion with patient   DVT Prophylaxis: mechanical compression device . Nutrition: Regular diet  Family Communication: family was present at bedside, opportunity was given to ask question and all questions were answered satisfactorily at the time of interview. Disposition: Admitted as inpatient,med-surge unit.  Author: Berle Mull, MD [facility] Pager: [phone]   If 7PM-7AM, please contact night-coverage  [email or web address] Password [email or web address]

## 2015-05-10 DIAGNOSIS — L03113 Cellulitis of right upper limb: Secondary | ICD-10-CM | POA: Insufficient documentation

## 2015-05-10 LAB — CBC
HEMATOCRIT: 33.2 % — AB (ref 36.0–46.0)
Hemoglobin: 10.8 g/dL — ABNORMAL LOW (ref 12.0–15.0)
MCH: 27.6 pg (ref 26.0–34.0)
MCHC: 32.5 g/dL (ref 30.0–36.0)
MCV: 84.7 fL (ref 78.0–100.0)
Platelets: 172 10*3/uL (ref 150–400)
RBC: 3.92 MIL/uL (ref 3.87–5.11)
RDW: 13.5 % (ref 11.5–15.5)
WBC: 5.4 10*3/uL (ref 4.0–10.5)

## 2015-05-10 LAB — ANTINUCLEAR ANTIBODIES, IFA: ANA Ab, IFA: NEGATIVE

## 2015-05-10 LAB — C4 COMPLEMENT: COMPLEMENT C4, BODY FLUID: 5 mg/dL — AB (ref 14–44)

## 2015-05-10 LAB — BASIC METABOLIC PANEL
Anion gap: 6 (ref 5–15)
BUN: 13 mg/dL (ref 6–20)
CO2: 27 mmol/L (ref 22–32)
Calcium: 8.6 mg/dL — ABNORMAL LOW (ref 8.9–10.3)
Chloride: 107 mmol/L (ref 101–111)
Creatinine, Ser: 0.86 mg/dL (ref 0.44–1.00)
GFR calc Af Amer: >60 mL/min (ref >60)
GFR, EST NON AFRICAN AMERICAN: 58 mL/min — AB (ref >60)
GLUCOSE: 122 mg/dL — AB (ref 65–99)
Potassium: 4.1 mmol/L (ref 3.5–5.1)
Sodium: 140 mmol/L (ref 135–145)

## 2015-05-10 LAB — C3 COMPLEMENT: C3 Complement: 110 mg/dL (ref 82–167)

## 2015-05-10 MED ORDER — DOXYCYCLINE HYCLATE 100 MG PO TABS
100.0000 mg | ORAL_TABLET | Freq: Two times a day (BID) | ORAL | Status: DC
  Administered 2015-05-10: 100 mg via ORAL
  Filled 2015-05-10 (×2): qty 1

## 2015-05-10 MED ORDER — DOXYCYCLINE HYCLATE 100 MG PO TABS: 100.0000 mg | ORAL_TABLET | Freq: Two times a day (BID) | ORAL | Status: DC

## 2015-05-10 NOTE — Evaluation (Signed)
Occupational Therapy Evaluation Patient Details Name: Sheila Spears MRN: [id number] DOB: 08-25-1922 Today's Date: 05/10/2015    History of Present Illness The patient was brought in because of skin rash   Clinical Impression  Pt is at independent baseline level of function with ADLs and functional mobility. Pt does not require an ADL for mobility. No further acute OT services are indicated at this time    Follow Up Recommendations   none   Equipment Recommendations   none   Recommendations for Other Services       Precautions / Restrictions Precautions Precautions: None Restrictions Weight Bearing Restrictions: No      Mobility Bed Mobility Overal bed mobility: Independent                Transfers Overall transfer level: Independent Equipment used: 1 person hand held assist                  Balance Overall balance assessment: No apparent balance deficits (not formally assessed)                                          ADL Overall ADL's : At baseline;Independent                                       General ADL Comments: Pt able to ambulate to bathroom, stand at closet and drawers to retrieve and put away items without physical assist     Vision  wears glasses, no change from baseline   Perception Perception Perception Tested?: No   Praxis Praxis Praxis tested?: Not tested    Pertinent Vitals/Pain Pain Assessment: 0-10 Pain Score: 3  Pain Location: B feet Pain Descriptors / Indicators: Tightness Pain Intervention(s): Monitored during session     Hand Dominance Right   Extremity/Trunk Assessment Upper Extremity Assessment Upper Extremity Assessment:  (edema R UE)   Lower Extremity Assessment Lower Extremity Assessment: Defer to PT evaluation   Cervical / Trunk Assessment Cervical / Trunk Assessment: Normal   Communication Communication Communication: No difficulties   Cognition  Arousal/Alertness: Awake/alert Behavior During Therapy: WFL for tasks assessed/performed Overall Cognitive Status: Within Functional Limits for tasks assessed                     General Comments   Pt very pleasant and cooperative       Other Exercises Other Exercises: Pt instructed to elevate R UE to decrease edema         Home Living Family/patient expects to be discharged to:: Private residence Living Arrangements: Alone Available Help at Discharge: Family Type of Home: House Home Access: Stairs to enter Secretary/administrator of Steps: 1   Home Layout: One level     Bathroom Shower/Tub: Chief Strategy Officer: Standard     Home Equipment: Grab bars - tub/shower          Prior Functioning/Environment Level of Independence: Independent             OT Diagnosis: Acute pain   OT Problem List: Increased edema   OT Treatment/Interventions:      OT Goals(Current goals can be found in the care plan section) Acute Rehab OT Goals Patient Stated Goal: go home OT Goal Formulation: With patient  OT Frequency:  Barriers to D/C:  none                        End of Session Equipment Utilized During Treatment:  (none)  Activity Tolerance: Patient tolerated treatment well Patient left: in chair;with call bell/phone within reach   Time: 0934-0959 OT Time Calculation (min): 25 min Charges:  OT General Charges $OT Visit: 1 Procedure OT Evaluation $Initial OT Evaluation Tier I: 1 Procedure OT Treatments $Therapeutic Activity: 8-22 mins G-Codes:    Galen Manila 05/10/2015, 10:58 AM

## 2015-05-10 NOTE — Evaluation (Signed)
Physical Therapy Evaluation Patient Details Name: Sheila Spears MRN: [id number] DOB: September 26, 1922 Today's Date: 05/10/2015   History of Present Illness  The patient was brought in because of skin rash  Clinical Impression  Patient is at Mod I to supervision level for all mobility and gait.  No acute PT needs identified - PT will sign off.    Follow Up Recommendations No PT follow up;Supervision - Intermittent    Equipment Recommendations  None recommended by PT    Recommendations for Other Services       Precautions / Restrictions Precautions Precautions: None Restrictions Weight Bearing Restrictions: No      Mobility  Bed Mobility                  Transfers Overall transfer level: Needs assistance Equipment used: None Transfers: Sit to/from Stand Sit to Stand: Supervision         General transfer comment: Assist for safety.  Ambulation/Gait Ambulation/Gait assistance: Supervision Ambulation Distance (Feet): 200 Feet Assistive device: None Gait Pattern/deviations: Step-through pattern;Decreased stride length   Gait velocity interpretation: at or above normal speed for age/gender General Gait Details: Patient with good gait pattern, speed, and balance.  Stairs            Wheelchair Mobility    Modified Rankin (Stroke Patients Only)       Balance Overall balance assessment: Independent         Standing balance support: No upper extremity supported Standing balance-Leahy Scale: Good   Single Leg Stance - Right Leg: 14 Single Leg Stance - Left Leg: 14     Rhomberg - Eyes Opened: 30 Rhomberg - Eyes Closed: 20 High level balance activites: Direction changes;Turns;Sudden stops;Head turns (Stepping over obstacles) High Level Balance Comments: No loss of balance with high level balance activities             Pertinent Vitals/Pain Pain Assessment: No/denies pain    Home Living Family/patient expects to be discharged to:: Private  residence Living Arrangements: Alone Available Help at Discharge: Family;Available PRN/intermittently Type of Home: House Home Access: Stairs to enter   Entrance Stairs-Number of Steps: 1 Home Layout: One level Home Equipment: Cane - single point;Bedside commode;Shower seat Additional Comments: Has equipment "in shed", but doesn't use it.    Prior Function Level of Independence: Independent               Hand Dominance   Dominant Hand: Right    Extremity/Trunk Assessment   Upper Extremity Assessment: Overall WFL for tasks assessed           Lower Extremity Assessment: Overall WFL for tasks assessed      Cervical / Trunk Assessment: Normal  Communication   Communication: No difficulties  Cognition Arousal/Alertness: Awake/alert Behavior During Therapy: WFL for tasks assessed/performed Overall Cognitive Status: Within Functional Limits for tasks assessed                      General Comments      Exercises        Assessment/Plan    PT Assessment Patent does not need any further PT services  PT Diagnosis Abnormality of gait   PT Problem List    PT Treatment Interventions     PT Goals (Current goals can be found in the Care Plan section) Acute Rehab PT Goals PT Goal Formulation: All assessment and education complete, DC therapy    Frequency     Barriers to discharge  Co-evaluation               End of Session   Activity Tolerance: Patient tolerated treatment well Patient left: in chair;with call bell/phone within reach;with family/visitor present Nurse Communication: Mobility status (No f/u needs)         Time: 1610-9604 PT Time Calculation (min) (ACUTE ONLY): 16 min   Charges:   PT Evaluation $Initial PT Evaluation Tier I: 1 Procedure     PT G CodesVena Austria 05/20/15, 7:33 PM Durenda Hurt. Renaldo Fiddler, [facility] Acute Rehab Services Pager [phone]

## 2015-05-10 NOTE — Discharge Instructions (Signed)
Drug Rash Skin reactions can be caused by several different drugs. Allergy to the medicine can cause itching, hives, and other rashes. Sun exposure causes a red rash with some medicines. Mononucleosis virus can cause a similar red rash when you are taking antibiotics. Sometimes, the rash may be accompanied by pain. The drug rash may happen with new drugs or with medicines that you have been taking for a while. The rash cannot be spread from person to person. In most cases, the symptoms of a drug rash are gone within a few days of stopping the medicine. Your rash, including hives (urticaria), is most likely from the following medicines:  Antibiotics or antimicrobials.  Anticonvulsants or seizure medicines.  Antihypertensives or blood pressure medicines.  Antimalarials.  Antidepressants or depression medicines.  Antianxiety drugs.  Diuretics or water pills.  Nonsteroidal anti-inflammatory drugs.  Simvastatin.  Lithium.  Omeprazole.  Allopurinol.  Pseudoephedrine.  Amiodarone.  Packed red blood cells, when you get a blood transfusion.  Contrast media, such as when getting an imaging test (CT or CAT scan). This drug list is not all inclusive, but drug rashes have been reported with all the medicines listed above. Your caregiver will tell you which medicines to avoid. If you react to a medicine, a similar or worse reaction can occur the next time you take it. If you need to stop taking an antibiotic because of a drug rash, an alternative antibiotic may be needed to get rid of your infection. Antihistamine or cortisone drugs may be prescribed to help relieve your symptoms. Stay out of the sun until the rash is completely gone.  Be sure to let your caregiver know about your drug reaction. Do not take this medicine in the future. Call your caregiver if your drug rash does not improve within 3 to 4 days. SEEK IMMEDIATE MEDICAL CARE IF:   You develop breathing problems, swelling in the  throat, or wheezing.  You have weakness, fainting, fever, and muscle or joint pains.  You develop blisters or peeling of skin, especially around the mouth. Document Released: 12/18/2004 Document Revised: 03/27/2014 Document Reviewed: 09/28/2008 [facility] Patient Information 2015 [place], [place]. This information is not intended to replace advice given to you by your health care provider. Make sure you discuss any questions you have with your health care provider.   Cellulitis Cellulitis is an infection of the skin and the tissue beneath it. The infected area is usually red and tender. Cellulitis occurs most often in the arms and lower legs.  CAUSES  Cellulitis is caused by bacteria that enter the skin through cracks or cuts in the skin. The most common types of bacteria that cause cellulitis are staphylococci and streptococci. SIGNS AND SYMPTOMS   Redness and warmth.  Swelling.  Tenderness or pain.  Fever. DIAGNOSIS  Your health care provider can usually determine what is wrong based on a physical exam. Blood tests may also be done. TREATMENT  Treatment usually involves taking an antibiotic medicine. HOME CARE INSTRUCTIONS   Take your antibiotic medicine as directed by your health care provider. Finish the antibiotic even if you start to feel better.  Keep the infected arm or leg elevated to reduce swelling.  Apply a warm cloth to the affected area up to 4 times per day to relieve pain.  Take medicines only as directed by your health care provider.  Keep all follow-up visits as directed by your health care provider. SEEK MEDICAL CARE IF:   You notice red streaks coming from  the infected area.  Your red area gets larger or turns dark in color.  Your bone or joint underneath the infected area becomes painful after the skin has healed.  Your infection returns in the same area or another area.  You notice a swollen bump in the infected area.  You develop new  symptoms.  You have a fever. SEEK IMMEDIATE MEDICAL CARE IF:   You feel very sleepy.  You develop vomiting or diarrhea.  You have a general ill feeling (malaise) with muscle aches and pains. MAKE SURE YOU:   Understand these instructions.  Will watch your condition.  Will get help right away if you are not doing well or get worse. Document Released: 08/20/2005 Document Revised: 03/27/2014 Document Reviewed: 01/26/2012 [facility] Patient Information 2015 [place], [place]. This information is not intended to replace advice given to you by your health care provider. Make sure you discuss any questions you have with your health care provider.

## 2015-05-10 NOTE — Discharge Summary (Signed)
[facility]  Physician Discharge Summary   Patient ID: Sheila BASHA MRN: [id number] DOB/AGE: 1922/10/08 81 y.o.  Admit date: 05/08/2015 Discharge date: 05/10/2015  PCP: Junious Silk, MD  DISCHARGE DIAGNOSES:  Principal Problem:   Cellulitis Active Problems:   Essential hypertension   Macular erythematous rash, nonblanching   Cellulitis of right upper extremity   RECOMMENDATIONS FOR OUTPATIENT FOLLOW UP: 1. Please follow the petechial rash to resolution.  DISCHARGE CONDITION: fair  Diet recommendation: Low sodium  Filed Weights   05/08/15 1417 05/08/15 2048  Weight: 65.772 kg (145 lb) 65.6 kg (144 lb 10 oz)    INITIAL HISTORY: 81 year old African-American female with a past medical history of hypertension, was brought in with chief complaint of skin rash. Patient had received a pneumococcal vaccination on her right. A few days ago. She subsequently noticed redness involving that arm. She was treated with oral Keflex for presumed cellulitis. She was given an injection of ceftriaxone prior to that. She presented due to worsening of this rash with development of a new rash in her legs. She was subsequently admitted for further management.  HOSPITAL COURSE:   Cellulitis of the right upper extremity Likely secondary to the recent vaccination. Cellulitis improved with vancomycin. Patient was switched over to doxycycline. Her arm is much better today.  Bilateral lower extremity petechial rash Could be secondary to a drug reaction. She was on Keflex which has been discontinued. This rash appears to be stable, perhaps slightly better. Possibility also remains for leukocytoclastic vasculitis. Patient was reassured. She was told that if the rash doesn't resolve in 2 weeks' time she may need to be seen by a dermatologist.   History of essential hypertension Stable. Continue home medications.  Overall improved. Patient keen on going home. She has been ambulating  without any difficulties. Seen by physical and occupational therapy and has been cleared. Okay for discharge.   PERTINENT LABS:  The results of significant diagnostics from this hospitalization (including imaging, microbiology, ancillary and laboratory) are listed below for reference.    Microbiology: Recent Results (from the past 240 hour(s))  Culture, blood (x 2)     Status: None (Preliminary result)   Collection Time: 05/08/15  7:27 PM  Result Value Ref Range Status   Specimen Description BLOOD LEFT ARM  Final   Special Requests BOTTLES DRAWN AEROBIC AND ANAEROBIC 5CC EA  Final   Culture   Final           BLOOD CULTURE RECEIVED NO GROWTH TO DATE CULTURE WILL BE HELD FOR 5 DAYS BEFORE ISSUING A FINAL NEGATIVE REPORT Performed at [facility]    Report Status PENDING  Incomplete  Culture, blood (x 2)     Status: None (Preliminary result)   Collection Time: 05/08/15  7:43 PM  Result Value Ref Range Status   Specimen Description BLOOD RIGHT HAND  Final   Special Requests BOTTLES DRAWN AEROBIC AND ANAEROBIC 5CC EA  Final   Culture   Final           BLOOD CULTURE RECEIVED NO GROWTH TO DATE CULTURE WILL BE HELD FOR 5 DAYS BEFORE ISSUING A FINAL NEGATIVE REPORT Performed at [facility]    Report Status PENDING  Incomplete     Labs: Basic Metabolic Panel:  Recent Labs Lab 05/08/15 1648 05/09/15 0614 05/10/15 0528  NA 136 139 140  K 4.4 4.1 4.1  CL 102 105 107  CO2 24 26 27   GLUCOSE 122* 114* 122*  BUN  CREATININE 0.79 0.73 0.86  CALCIUM 9.6 8.6* 8.6*   Liver Function Tests:  Recent Labs Lab 05/09/15 0614  AST 17  ALT 20  ALKPHOS 67  BILITOT 0.7  PROT 6.0*  ALBUMIN 2.4*   CBC:  Recent Labs Lab 05/08/15 1648 05/09/15 0614 05/10/15 0528  WBC 6.5 4.8 5.4  NEUTROABS 3.7 2.3  --   HGB 11.8* 10.8* 10.8*  HCT 35.0* 32.6* 33.2*  MCV 85.2 83.8 84.7  PLT 144* 162 172   Cardiac Enzymes:  Recent Labs Lab 05/08/15 1927  CKTOTAL 75      IMAGING STUDIES No results found.  DISCHARGE EXAMINATION: Filed Vitals:   05/09/15 0549 05/09/15 1422 05/09/15 2132 05/10/15 0529  BP: 125/48 120/64 140/52 142/65  Pulse: 89 81 76 72  Temp: 98.1 F (36.7 C) 98.2 F (36.8 C) 98.6 F (37 C) 98.5 F (36.9 C)  TempSrc: Oral Oral Oral Oral  Resp: Height:      Weight:      SpO2: 97% 98% 98% 100%   General appearance: alert, cooperative, appears stated age and no distress Resp: clear to auscultation bilaterally Cardio: regular rate and rhythm, S1, S2 normal, no murmur, click, rub or gallop GI: soft, non-tender; bowel sounds normal; no masses,  no organomegaly Neurologic: Grossly normal Right upper extremity shows improvement in erythema and swelling. Petechial rash in the lower extremities appear to be improved and less erythematous.  DISPOSITION: Home  Discharge Instructions    Call MD for:  difficulty breathing, headache or visual disturbances    Complete by:  As directed      Call MD for:  extreme fatigue    Complete by:  As directed      Call MD for:  hives    Complete by:  As directed      Call MD for:  persistant dizziness or light-headedness    Complete by:  As directed      Call MD for:  persistant nausea and vomiting    Complete by:  As directed      Call MD for:  redness, tenderness, or signs of infection (pain, swelling, redness, odor or green/yellow discharge around incision site)    Complete by:  As directed      Call MD for:  temperature >100.4    Complete by:  As directed      Diet - low sodium heart healthy    Complete by:  As directed      Discharge instructions    Complete by:  As directed   Please follow up with your PCP. Drug rash in the legs should resolve in 2 weeks. If it gets worse or doesn't improve please talk to your PCP as you may need to be referred to a dermatologist.  You were cared for by a hospitalist during your hospital stay. If you have any questions about your discharge  medications or the care you received while you were in the hospital after you are discharged, you can call the unit and asked to speak with the hospitalist on call if the hospitalist that took care of you is not available. Once you are discharged, your primary care physician will handle any further medical issues. Please note that NO REFILLS for any discharge medications will be authorized once you are discharged, as it is imperative that you return to your primary care physician (or establish a relationship with a primary care physician if you do  not have one) for your aftercare needs so that they can reassess your need for medications and monitor your lab values. If you do not have a primary care physician, you can call [phone] for a physician referral.     Increase activity slowly    Complete by:  As directed            ALLERGIES:  Allergies  Allergen Reactions  . Keflex [Cephalexin] Rash     Discharge Medication List as of 05/10/2015  3:09 PM    START taking these medications   Details  doxycycline (VIBRA-TABS) 100 MG tablet Take 1 tablet (100 mg total) by mouth every 12 (twelve) hours. Starting tonight for 8 days., Starting 05/10/2015, Until Discontinued, Print      CONTINUE these medications which have NOT CHANGED   Details  diltiazem (DILACOR XR) 180 MG 24 hr capsule Take 180 mg by mouth daily., Until Discontinued, Historical Med    ferrous sulfate 325 (65 FE) MG tablet Take 325 mg by mouth as needed (ONLY TAKES WHEN NEEDED, AD DIRECTED BY MD). , Until Discontinued, Historical Med    latanoprost (XALATAN) 0.005 % ophthalmic solution Place 1 drop into both eyes at bedtime. , Until Discontinued, Historical Med    Multiple Vitamin (MULTIVITAMIN) capsule Take 1 capsule by mouth as needed (ONLY TAKES SOMETIMES). , Until Discontinued, Historical Med    Multiple Vitamins-Minerals (PRESERVISION AREDS) CAPS Take 1 capsule by mouth daily., Until Discontinued, Historical Med    Omega-3  Fatty Acids (FISH OIL PO) Take 1 capsule by mouth at bedtime., Until Discontinued, Historical Med    valsartan (DIOVAN) 320 MG tablet Take 320 mg by mouth daily., Until Discontinued, Historical Med    ipratropium (ATROVENT) 0.06 % nasal spray Place 2 sprays into both nostrils 4 (four) times daily., Starting 11/19/2013, Until Discontinued, Print      STOP taking these medications     cephALEXin (KEFLEX) 500 MG capsule        Follow-up Information    Follow up with ALTHEIMER,MICHAEL D, MD. Schedule an appointment as soon as possible for a visit in 1 week.   Specialty:  Endocrinology   Why:  post hospitalization follow up   Contact information:   [facility] [place] [address] [phone]       TOTAL DISCHARGE TIME: 35 minutes  [facility]  [facility] Pager [phone]  05/10/2015, 5:44 PM

## 2015-05-10 NOTE — Progress Notes (Signed)
Patient was discharged home by MD order; discharged instructions review and give to patient with care notes and prescriptions; IV DIC; skin intact; patient will be escorted to the car by nurse tech via wheelchair.  

## 2015-05-15 LAB — CULTURE, BLOOD (ROUTINE X 2)
CULTURE: NO GROWTH
Culture: NO GROWTH

## 2015-06-20 ENCOUNTER — Ambulatory Visit (INDEPENDENT_AMBULATORY_CARE_PROVIDER_SITE_OTHER): Payer: Medicare Other | Admitting: Podiatry

## 2015-06-20 ENCOUNTER — Encounter: Payer: Self-pay | Admitting: Podiatry

## 2015-06-20 VITALS — BP 153/76 | HR 71 | Ht 63.0 in | Wt 146.0 lb

## 2015-06-20 DIAGNOSIS — B351 Tinea unguium: Secondary | ICD-10-CM | POA: Insufficient documentation

## 2015-06-20 DIAGNOSIS — M79606 Pain in leg, unspecified: Secondary | ICD-10-CM | POA: Insufficient documentation

## 2015-06-20 NOTE — Progress Notes (Signed)
SUBJECTIVE: 81 y.o. year old female presents requesting toe nails trimmed. Patient came in with her daughter.   REVIEW OF SYSTEMS: A comprehensive review of systems was negative.  Objective: Dermatologic: Thick yellow deformed nails x 10.  Vascular: Pedal pulses are all palpable.  Orthopedic: S/P Bilateral hip replacement.  Neurologic: Epicritic sensations are grossly intact.   Assessment:  Dystrophic mycotic nails x 10.  Pain in lower limbs.  Treatment: All mycotic nails debrided.

## 2015-06-20 NOTE — Patient Instructions (Signed)
Seen for hypertrophic nails. All nails debrided. Return in 3 months or as needed.  

## 2015-09-21 ENCOUNTER — Ambulatory Visit: Payer: Medicare Other | Admitting: Podiatry

## 2015-10-04 ENCOUNTER — Encounter: Payer: Self-pay | Admitting: Podiatry

## 2015-10-04 ENCOUNTER — Ambulatory Visit (INDEPENDENT_AMBULATORY_CARE_PROVIDER_SITE_OTHER): Payer: Medicare Other | Admitting: Podiatry

## 2015-10-04 VITALS — BP 159/75 | HR 69

## 2015-10-04 DIAGNOSIS — B351 Tinea unguium: Secondary | ICD-10-CM | POA: Diagnosis not present

## 2015-10-04 NOTE — Progress Notes (Signed)
SUBJECTIVE: 81 y.o. year old female presents requesting toe nails trimmed.  She just had her physical and everything is ok.  Patient came in with her daughter.   Objective: Dermatologic: Thick yellow deformed nails x 10.  Vascular: Pedal pulses are all palpable.  Orthopedic: S/P Bilateral hip replacement.  Neurologic: Epicritic sensations are grossly intact.   Assessment:  Dystrophic mycotic nails x 10.  Pain in lower limbs.  Treatment: All mycotic nails debrided.

## 2015-10-04 NOTE — Patient Instructions (Signed)
Seen for hypertrophic nails. All nails debrided. Return in 3 months or as needed.  

## 2016-01-03 ENCOUNTER — Encounter: Payer: Self-pay | Admitting: Podiatry

## 2016-01-03 ENCOUNTER — Ambulatory Visit (INDEPENDENT_AMBULATORY_CARE_PROVIDER_SITE_OTHER): Payer: Medicare Other | Admitting: Podiatry

## 2016-01-03 VITALS — BP 171/77 | HR 70

## 2016-01-03 DIAGNOSIS — M79606 Pain in leg, unspecified: Secondary | ICD-10-CM

## 2016-01-03 DIAGNOSIS — B351 Tinea unguium: Secondary | ICD-10-CM

## 2016-01-03 NOTE — Progress Notes (Signed)
SUBJECTIVE: 82 y.o. year old female presents requesting toe nails trimmed. Nails are too long, thick and hurts to walk. Patient denies any new problem.  Patient came in with her daughter.   Objective: Dermatologic: Thick yellow deformed nails x 10.  Vascular: Pedal pulses are all palpable. No abnormal skin lesions. Orthopedic: S/P Bilateral hip replacement.  Neurologic: Epicritic sensations are grossly intact.   Assessment:  Dystrophic mycotic nails x 10.  Pain in lower limbs.  Treatment: All mycotic nails debrided.

## 2016-01-03 NOTE — Patient Instructions (Signed)
Seen for hypertrophic nails. All nails debrided. Return in 3 months or as needed.  

## 2016-04-01 ENCOUNTER — Encounter: Payer: Self-pay | Admitting: Podiatry

## 2016-04-01 ENCOUNTER — Ambulatory Visit (INDEPENDENT_AMBULATORY_CARE_PROVIDER_SITE_OTHER): Payer: Medicare Other | Admitting: Podiatry

## 2016-04-01 VITALS — BP 151/68 | HR 75

## 2016-04-01 DIAGNOSIS — M79606 Pain in leg, unspecified: Secondary | ICD-10-CM | POA: Diagnosis not present

## 2016-04-01 DIAGNOSIS — B351 Tinea unguium: Secondary | ICD-10-CM | POA: Diagnosis not present

## 2016-04-01 NOTE — Progress Notes (Signed)
SUBJECTIVE: 82 y.o. year old female presents requesting toe nails trimmed. Nails are too long, thick and hurts to walk. Denies any new problems.   Objective: Dermatologic: Thick yellow deformed nails x 10.  Vascular: Pedal pulses are all palpable. No abnormal skin lesions. Orthopedic: S/P Bilateral hip replacement.  Neurologic: Epicritic sensations are grossly intact.   Assessment:  Dystrophic mycotic nails x 10.  Pain in lower limbs.  Treatment: All mycotic nails debrided. Return in 3 months or as needed.

## 2016-04-01 NOTE — Patient Instructions (Signed)
Seen for hypertrophic nails. All nails debrided. Return in 3 months or as needed.  

## 2016-07-02 ENCOUNTER — Encounter: Payer: Self-pay | Admitting: Podiatry

## 2016-07-02 ENCOUNTER — Ambulatory Visit (INDEPENDENT_AMBULATORY_CARE_PROVIDER_SITE_OTHER): Payer: Medicare Other | Admitting: Podiatry

## 2016-07-02 VITALS — BP 158/76 | HR 67

## 2016-07-02 DIAGNOSIS — M79606 Pain in leg, unspecified: Secondary | ICD-10-CM

## 2016-07-02 DIAGNOSIS — B351 Tinea unguium: Secondary | ICD-10-CM | POA: Diagnosis not present

## 2016-07-02 DIAGNOSIS — M79673 Pain in unspecified foot: Secondary | ICD-10-CM | POA: Diagnosis not present

## 2016-07-02 NOTE — Patient Instructions (Signed)
Seen for hypertrophic nails. All nails debrided. Return in 3 months or as needed.  

## 2016-07-02 NOTE — Progress Notes (Signed)
SUBJECTIVE: 82 y.o. year old female presents requesting toe nails trimmed. Nails are too long, thick and hurts to walk. Denies any new problems.   Objective: Dermatologic: Thick yellow deformed nails x 10.  Vascular: Pedal pulses are all palpable. No abnormal skin lesions. Orthopedic: S/P Bilateral hip replacement.  Neurologic: Epicritic sensations are grossly intact.   Assessment:  Dystrophic mycotic nails x 10.  Pain in lower limbs.  Treatment: All mycotic nails debrided. Return in 3 months or as needed.

## 2016-10-02 ENCOUNTER — Ambulatory Visit (INDEPENDENT_AMBULATORY_CARE_PROVIDER_SITE_OTHER): Payer: Medicare Other | Admitting: Podiatry

## 2016-10-02 ENCOUNTER — Encounter: Payer: Self-pay | Admitting: Podiatry

## 2016-10-02 VITALS — BP 170/74 | HR 71

## 2016-10-02 DIAGNOSIS — M79671 Pain in right foot: Secondary | ICD-10-CM

## 2016-10-02 DIAGNOSIS — B351 Tinea unguium: Secondary | ICD-10-CM

## 2016-10-02 DIAGNOSIS — M79672 Pain in left foot: Secondary | ICD-10-CM | POA: Diagnosis not present

## 2016-10-02 NOTE — Progress Notes (Signed)
SUBJECTIVE: 82 y.o. year old female presents requesting toe nails trimmed. Nails are too long, thick and hurts to walk. Denies any new problems.   Objective: Dermatologic:Thick yellow deformed nails x 10.  No abnormal skin lesions noted. Vascular:Pedal pulses are all palpable. No abnormal skin lesions. Orthopedic:S/P Bilateral hip replacement.  No gross osseous deformities noted. Neurologic:Epicritic sensations are grossly intact.   Assessment: Dystrophic mycotic nails x 10.  Pain in lower limbs.  Treatment: All mycotic nails debrided. Return in 3 months or as needed.

## 2016-10-02 NOTE — Patient Instructions (Signed)
Seen for hypertrophic nails. All nails debrided. Return in 3 months or as needed.  

## 2016-12-23 ENCOUNTER — Other Ambulatory Visit: Payer: Self-pay | Admitting: Orthopedic Surgery

## 2017-01-01 ENCOUNTER — Ambulatory Visit (INDEPENDENT_AMBULATORY_CARE_PROVIDER_SITE_OTHER): Payer: Medicare Other | Admitting: Podiatry

## 2017-01-01 ENCOUNTER — Encounter: Payer: Self-pay | Admitting: Podiatry

## 2017-01-01 DIAGNOSIS — M79671 Pain in right foot: Secondary | ICD-10-CM | POA: Diagnosis not present

## 2017-01-01 DIAGNOSIS — B351 Tinea unguium: Secondary | ICD-10-CM

## 2017-01-01 DIAGNOSIS — M79672 Pain in left foot: Secondary | ICD-10-CM

## 2017-01-01 NOTE — Patient Instructions (Signed)
Seen for hypertrophic nails. All nails debrided. Return in 3 months or as needed.  

## 2017-01-01 NOTE — Progress Notes (Signed)
SUBJECTIVE: 83 y.o. year old female presents requesting painful toe nails trimmed.   Objective: Dermatologic:Thick yellow deformed nails x 10.  No abnormal skin lesions noted. Vascular:Pedal pulses are all palpable. No abnormal skin lesions. Orthopedic:S/P Bilateral hip replacement.  No gross osseous deformities noted. Neurologic:Epicritic sensations are grossly intact.   Assessment: Dystrophic mycotic nails x 10.  Pain in lower limbs.  Treatment: All mycotic nails debrided. Return in 3 months or as needed.

## 2017-01-08 ENCOUNTER — Encounter (HOSPITAL_BASED_OUTPATIENT_CLINIC_OR_DEPARTMENT_OTHER): Payer: Self-pay | Admitting: *Deleted

## 2017-01-15 ENCOUNTER — Encounter (HOSPITAL_BASED_OUTPATIENT_CLINIC_OR_DEPARTMENT_OTHER): Payer: Self-pay | Admitting: *Deleted

## 2017-01-15 ENCOUNTER — Ambulatory Visit (HOSPITAL_BASED_OUTPATIENT_CLINIC_OR_DEPARTMENT_OTHER)
Admission: RE | Admit: 2017-01-15 | Discharge: 2017-01-15 | Disposition: A | Discharge status: Home / Self Care | Payer: Medicare Other | Source: Ambulatory Visit | Attending: Orthopedic Surgery | Admitting: Orthopedic Surgery

## 2017-01-15 ENCOUNTER — Encounter (HOSPITAL_BASED_OUTPATIENT_CLINIC_OR_DEPARTMENT_OTHER)
Admission: RE | Disposition: A | Discharge status: Home / Self Care | Payer: Self-pay | Source: Ambulatory Visit | Attending: Orthopedic Surgery

## 2017-01-15 ENCOUNTER — Ambulatory Visit (HOSPITAL_BASED_OUTPATIENT_CLINIC_OR_DEPARTMENT_OTHER): Payer: Medicare Other | Admitting: Anesthesiology

## 2017-01-15 DIAGNOSIS — I10 Essential (primary) hypertension: Secondary | ICD-10-CM | POA: Insufficient documentation

## 2017-01-15 DIAGNOSIS — Z79899 Other long term (current) drug therapy: Secondary | ICD-10-CM | POA: Insufficient documentation

## 2017-01-15 DIAGNOSIS — M65341 Trigger finger, right ring finger: Secondary | ICD-10-CM | POA: Insufficient documentation

## 2017-01-15 DIAGNOSIS — H409 Unspecified glaucoma: Secondary | ICD-10-CM | POA: Diagnosis not present

## 2017-01-15 HISTORY — PX: TRIGGER FINGER RELEASE: SHX641

## 2017-01-15 SURGERY — RELEASE, A1 PULLEY, FOR TRIGGER FINGER
Anesthesia: Monitor Anesthesia Care | Site: Finger | Laterality: Right

## 2017-01-15 MED ORDER — FENTANYL CITRATE (PF) 100 MCG/2ML IJ SOLN
INTRAMUSCULAR | Status: AC
  Filled 2017-01-15: qty 2

## 2017-01-15 MED ORDER — SCOPOLAMINE 1 MG/3DAYS TD PT72: 1.0000 | MEDICATED_PATCH | Freq: Once | TRANSDERMAL | Status: DC | PRN

## 2017-01-15 MED ORDER — ACETAMINOPHEN 325 MG PO TABS: 325.0000 mg | ORAL_TABLET | ORAL | Status: DC | PRN

## 2017-01-15 MED ORDER — LIDOCAINE HCL (PF) 0.5 % IJ SOLN
INTRAMUSCULAR | Status: DC | PRN
  Administered 2017-01-15: 30 mL via INTRAVENOUS

## 2017-01-15 MED ORDER — ONDANSETRON HCL 4 MG/2ML IJ SOLN: 4.0000 mg | Freq: Once | INTRAMUSCULAR | Status: DC | PRN

## 2017-01-15 MED ORDER — EPHEDRINE 5 MG/ML INJ
INTRAVENOUS | Status: AC
  Filled 2017-01-15: qty 10

## 2017-01-15 MED ORDER — ACETAMINOPHEN 160 MG/5ML PO SOLN: 325.0000 mg | ORAL | Status: DC | PRN

## 2017-01-15 MED ORDER — BUPIVACAINE HCL (PF) 0.25 % IJ SOLN
INTRAMUSCULAR | Status: DC | PRN
  Administered 2017-01-15: 5.5 mL

## 2017-01-15 MED ORDER — MIDAZOLAM HCL 2 MG/2ML IJ SOLN: 1.0000 mg | INTRAMUSCULAR | Status: DC | PRN

## 2017-01-15 MED ORDER — HYDROCODONE-ACETAMINOPHEN 5-325 MG PO TABS: ORAL_TABLET | ORAL | 0 refills | Status: DC

## 2017-01-15 MED ORDER — PROPOFOL 500 MG/50ML IV EMUL
INTRAVENOUS | Status: DC | PRN
  Administered 2017-01-15: 50 ug/kg/min via INTRAVENOUS

## 2017-01-15 MED ORDER — OXYCODONE HCL 5 MG PO TABS: 5.0000 mg | ORAL_TABLET | Freq: Once | ORAL | Status: DC | PRN

## 2017-01-15 MED ORDER — OXYCODONE HCL 5 MG/5ML PO SOLN: 5.0000 mg | Freq: Once | ORAL | Status: DC | PRN

## 2017-01-15 MED ORDER — VANCOMYCIN HCL IN DEXTROSE 1-5 GM/200ML-% IV SOLN
1000.0000 mg | INTRAVENOUS | Status: AC
  Administered 2017-01-15: 1000 mg via INTRAVENOUS

## 2017-01-15 MED ORDER — ONDANSETRON HCL 4 MG/2ML IJ SOLN
INTRAMUSCULAR | Status: AC
  Filled 2017-01-15: qty 2

## 2017-01-15 MED ORDER — CHLORHEXIDINE GLUCONATE 4 % EX LIQD: 60.0000 mL | Freq: Once | CUTANEOUS | Status: DC

## 2017-01-15 MED ORDER — VANCOMYCIN HCL IN DEXTROSE 1-5 GM/200ML-% IV SOLN
INTRAVENOUS | Status: AC
  Filled 2017-01-15: qty 200

## 2017-01-15 MED ORDER — FENTANYL CITRATE (PF) 100 MCG/2ML IJ SOLN
50.0000 ug | INTRAMUSCULAR | Status: DC | PRN
  Administered 2017-01-15 (×2): 25 ug via INTRAVENOUS

## 2017-01-15 MED ORDER — LACTATED RINGERS IV SOLN
INTRAVENOUS | Status: DC
  Administered 2017-01-15: 10:00:00 via INTRAVENOUS

## 2017-01-15 SURGICAL SUPPLY — 34 items
BANDAGE COBAN STERILE 2 (GAUZE/BANDAGES/DRESSINGS) ×3 IMPLANT
BLADE SURG 15 STRL LF DISP TIS (BLADE) ×2 IMPLANT
BLADE SURG 15 STRL SS (BLADE) ×4
BNDG CONFORM 2 STRL LF (GAUZE/BANDAGES/DRESSINGS) ×3 IMPLANT
BNDG ESMARK 4X9 LF (GAUZE/BANDAGES/DRESSINGS) IMPLANT
CHLORAPREP W/TINT 26ML (MISCELLANEOUS) ×3 IMPLANT
CORDS BIPOLAR (ELECTRODE) ×3 IMPLANT
COVER BACK TABLE 60X90IN (DRAPES) ×3 IMPLANT
COVER MAYO STAND STRL (DRAPES) ×3 IMPLANT
CUFF TOURNIQUET SINGLE 18IN (TOURNIQUET CUFF) ×3 IMPLANT
DRAPE EXTREMITY T 121X128X90 (DRAPE) ×3 IMPLANT
DRAPE SURG 17X23 STRL (DRAPES) ×3 IMPLANT
GAUZE SPONGE 4X4 12PLY STRL (GAUZE/BANDAGES/DRESSINGS) ×3 IMPLANT
GAUZE XEROFORM 1X8 LF (GAUZE/BANDAGES/DRESSINGS) ×3 IMPLANT
GLOVE BIO SURGEON STRL SZ 6.5 (GLOVE) ×4 IMPLANT
GLOVE BIO SURGEON STRL SZ7.5 (GLOVE) ×3 IMPLANT
GLOVE BIO SURGEONS STRL SZ 6.5 (GLOVE) ×2
GLOVE BIOGEL PI IND STRL 7.0 (GLOVE) ×1 IMPLANT
GLOVE BIOGEL PI IND STRL 8 (GLOVE) ×2 IMPLANT
GLOVE BIOGEL PI INDICATOR 7.0 (GLOVE) ×2
GLOVE BIOGEL PI INDICATOR 8 (GLOVE) ×4
GLOVE SURG SS PI 6.0 STRL IVOR (GLOVE) ×3 IMPLANT
GOWN STRL REUS W/ TWL LRG LVL3 (GOWN DISPOSABLE) ×1 IMPLANT
GOWN STRL REUS W/TWL LRG LVL3 (GOWN DISPOSABLE) ×2
GOWN STRL REUS W/TWL XL LVL3 (GOWN DISPOSABLE) ×3 IMPLANT
NEEDLE HYPO 25X1 1.5 SAFETY (NEEDLE) ×3 IMPLANT
NS IRRIG 1000ML POUR BTL (IV SOLUTION) ×3 IMPLANT
PACK BASIN DAY SURGERY FS (CUSTOM PROCEDURE TRAY) ×3 IMPLANT
STOCKINETTE 4X48 STRL (DRAPES) ×3 IMPLANT
SUT ETHILON 4 0 PS 2 18 (SUTURE) ×3 IMPLANT
SYR BULB 3OZ (MISCELLANEOUS) ×3 IMPLANT
SYR CONTROL 10ML LL (SYRINGE) ×3 IMPLANT
TOWEL OR 17X24 6PK STRL BLUE (TOWEL DISPOSABLE) ×6 IMPLANT
UNDERPAD 30X30 (UNDERPADS AND DIAPERS) ×3 IMPLANT

## 2017-01-15 NOTE — Brief Op Note (Signed)
01/15/2017  11:19 AM  PATIENT:  Sheila Spears  83 y.o. female  PRE-OPERATIVE DIAGNOSIS:  right ring finger  M65.341  POST-OPERATIVE DIAGNOSIS:  right ring finger  M65.341  PROCEDURE:  Procedure(s): RELEASE TRIGGER FINGER/A-1 PULLEY RIGHT RING FINGER (Right)  SURGEON:  Surgeon(s) and Role:    * Betha LoaKevin Tristan Bramble, MD - Primary  PHYSICIAN ASSISTANT:   ASSISTANTS: none   ANESTHESIA:   Bier block with sedation  EBL:  Total I/O In: 400 [I.V.:400] Out: -   BLOOD ADMINISTERED:none  DRAINS: none   LOCAL MEDICATIONS USED:  MARCAINE     SPECIMEN:  No Specimen  DISPOSITION OF SPECIMEN:  N/A  COUNTS:  YES  TOURNIQUET:   Total Tourniquet Time Documented: Forearm (Right) - 25 minutes Total: Forearm (Right) - 25 minutes   DICTATION: .Note written in EPIC  PLAN OF CARE: Discharge to home after PACU  PATIENT DISPOSITION:  PACU - hemodynamically stable.

## 2017-01-15 NOTE — H&P (Signed)
  Sheila PuttKathryn B Spears is an 83 y.o. female.   Chief Complaint: trigger finger HPI: 83 yo female with triggering of right ring finger.  This has been injected twice without lasting resolution.  She wishes to have a surgical release.    Allergies:  Allergies  Allergen Reactions  . Keflex [Cephalexin] Rash    Past Medical History:  Diagnosis Date  . Glaucoma   . Hypertension     Past Surgical History:  Procedure Laterality Date  . ABDOMINAL HYSTERECTOMY      Family History: Family History  Problem Relation Age of Onset  . Cancer - Colon Mother     Social History:   reports that she has never smoked. She has never used smokeless tobacco. She reports that she does not drink alcohol or use drugs.  Medications: Medications Prior to Admission  Medication Sig Dispense Refill  . diltiazem (DILACOR XR) 180 MG 24 hr capsule Take 180 mg by mouth daily.    . ferrous sulfate 325 (65 FE) MG tablet Take 325 mg by mouth as needed (ONLY TAKES WHEN NEEDED, AD DIRECTED BY MD).     Marland Kitchen. latanoprost (XALATAN) 0.005 % ophthalmic solution Place 1 drop into both eyes at bedtime.     . Multiple Vitamins-Minerals (PRESERVISION AREDS) CAPS Take 1 capsule by mouth daily.    . Omega-3 Fatty Acids (FISH OIL PO) Take 1 capsule by mouth at bedtime.    . valsartan (DIOVAN) 320 MG tablet Take 320 mg by mouth daily.      No results found for this or any previous visit (from the past 48 hour(s)).  No results found.   A comprehensive review of systems was negative.  Height 5\' 3"  (1.6 m), weight 72.6 kg (160 lb).  General appearance: alert, cooperative and appears stated age Head: Normocephalic, without obvious abnormality, atraumatic Neck: supple, symmetrical, trachea midline Resp: clear to auscultation bilaterally Cardio: regular rate and rhythm GI: non-tender Extremities: Intact sensation and capillary refill all digits.  +epl/fpl/io.  No wounds.  Pulses: 2+ and symmetric Skin: Skin color, texture,  turgor normal. No rashes or lesions Neurologic: Grossly normal Incision/Wound:none  Assessment/Plan Right ring finger trigger digit.  Non operative and operative treatment options were discussed with the patient and patient wishes to proceed with operative treatment. Risks, benefits, and alternatives of surgery were discussed and the patient agrees with the plan of care.   [name] R 01/15/2017, 9:15 AM

## 2017-01-15 NOTE — Transfer of Care (Signed)
Immediate Anesthesia Transfer of Care Note  Patient: Sheila Spears  Procedure(s) Performed: Procedure(s): RELEASE TRIGGER FINGER/A-1 PULLEY RIGHT RING FINGER (Right)  Patient Location: PACU  Anesthesia Type:MAC  Level of Consciousness: awake and alert   Airway & Oxygen Therapy: Patient Spontanous Breathing and Patient connected to face mask oxygen  Post-op Assessment: Report given to RN and Post -op Vital signs reviewed and stable  Post vital signs: Reviewed and stable  Last Vitals:  Vitals:   01/15/17 0951  BP: (!) 185/76  Pulse: 60  Resp: 18  Temp: 36.4 C    Last Pain: There were no vitals filed for this visit.    Patients Stated Pain Goal: 0 (11/25/70 5366)  Complications: No apparent anesthesia complications

## 2017-01-15 NOTE — Discharge Instructions (Addendum)

## 2017-01-15 NOTE — Op Note (Signed)
01/15/2017 [facility]  Operative Note  PREOPERATIVE DIAGNOSIS: right ring finger  M65.341  POSTOPERATIVE DIAGNOSIS:  right ring finger  M65.341  PROCEDURE: Procedure(s): RELEASE TRIGGER FINGER/A-1 PULLEY RIGHT RING FINGER  SURGEON:  Betha LoaKevin Shamaria Kavan, MD  ASSISTANT:  none.  ANESTHESIA:  Bier block and sedation.  IV FLUIDS:  Per anesthesia flow sheet.  ESTIMATED BLOOD LOSS:  Minimal.  COMPLICATIONS:  None.  SPECIMENS:  None.  TOURNIQUET TIME:  Total Tourniquet Time Documented: Forearm (Right) - 25 minutes Total: Forearm (Right) - 25 minutes   DISPOSITION:  Stable to PACU.  LOCATION: [facility]  INDICATIONS: Sheila PuttKathryn B Alfonzo is a 83 y.o. female with triggering right ring finger.  This has been injected twice without resolution.  She wishes to have a trigger release.  Risks, benefits and alternatives of surgery were discussed including the risk of blood loss, infection, damage to nerves, vessels, tendons, ligaments, bone, failure of surgery, need for additional surgery, complications with wound healing, continued pain, continued triggering and need for repeat surgery.  She voiced understanding of these risks and elected to proceed.  OPERATIVE COURSE:  After being identified preoperatively by myself, the patient and I agreed upon the procedure and site of procedure.  The surgical site was marked. The risks, benefits, and alternatives of surgery were reviewed and she wished to proceed.  Surgical consent had been signed. She was given IV vancomycin as preoperative antibiotic prophylaxis. She was transported to the operating room and placed on the operating room table in supine position with the Right upper extremity on an arm board. Bier block and sedation was induced by the anesthesiologist.  The Right upper extremity was prepped and draped in normal sterile orthopedic fashion. A surgical pause was performed between surgeons, anesthesia, and operating room  staff, and all were in agreement as to the patient, procedure, and site of procedure.  Tourniquet at the proximal aspect of the forearm had been inflated for the Bier block.  An incision was made at the volar aspect of the MP joint of the ring finger.  This was carried into the subcutaneous tissues by preading technique.  Bipolar electrocautery was used to obtain hemostasis.  The radial and ulnar digital nerves were protected throughout the case. The flexor sheath was identified.  The A1 pulley was identified and sharply incised.  It was released in its entirety.  The proximal 1-2 mm of the A2 pulley was vented to allow better excursion of the tendons.  The finger was placed through a range of motion and there was noted to be no catching.  The tendons were brought through the wound and any adherences released.  The wound was then copiously irrigated with sterile saline. It was closed with 4-0 nylon in a horizontal mattress fashion.  It was injected with 5 mL of 0.25% plain Marcaine to aid in postoperative analgesia.  It was dressed with sterile Xeroform, 4x4s, and wrapped lightly with a Coban dressing.  Tourniquet was deflated at 25 minutes.  The fingertips were pink with brisk capillary refill after deflation of the tourniquet.  The operative drapes were broken down and the patient was awoken from anesthesia safely.  She was transferred back to the stretcher and taken to the PACU in stable condition.   I will see her back in the office in 1 week for postoperative followup.  I will give her a prescription for norco 5/325 1-2 tabs po q6 hours prn pain, dispense #20.    Betha LoaKUZMA,Kelso Bibby  R, MD Electronically signed, 01/15/17

## 2017-01-15 NOTE — Anesthesia Procedure Notes (Signed)
Anesthesia Regional Block: Bier block (IV Regional)   Pre-Anesthetic Checklist: ,, timeout performed, Correct Patient, Correct Site, Correct Laterality, Correct Procedure,, site marked, surgical consent,, at surgeon's request  Laterality: Right     Needles:  Injection technique: Single-shot  Needle Type: Other      Needle Gauge: 20     Additional Needles:   Procedures:,,,,,,, Esmarch exsanguination, single tourniquet utilized,  Narrative:   Performed by: Personally       

## 2017-01-15 NOTE — Anesthesia Preprocedure Evaluation (Signed)
Anesthesia Evaluation  Patient identified by MRN, date of birth, ID band Patient awake    Reviewed: Allergy & Precautions, NPO status , Patient's Chart, lab work & pertinent test results  Airway Mallampati: I       Dental no notable dental hx.    Pulmonary neg pulmonary ROS,    Pulmonary exam normal        Cardiovascular hypertension, Pt. on medications Normal cardiovascular exam     Neuro/Psych negative neurological ROS     GI/Hepatic negative GI ROS, Neg liver ROS,   Endo/Other  negative endocrine ROS  Renal/GU negative Renal ROS  negative genitourinary   Musculoskeletal negative musculoskeletal ROS (+)   Abdominal Normal abdominal exam  (+)   Peds  Hematology negative hematology ROS (+)   Anesthesia Other Findings   Reproductive/Obstetrics                             Anesthesia Physical Anesthesia Plan  ASA: II  Anesthesia Plan: MAC and Bier Block   Post-op Pain Management:    Induction:   Airway Management Planned:   Additional Equipment:   Intra-op Plan:   Post-operative Plan:   Informed Consent: I have reviewed the patients History and Physical, chart, labs and discussed the procedure including the risks, benefits and alternatives for the proposed anesthesia with the patient or authorized representative who has indicated his/her understanding and acceptance.     Plan Discussed with: CRNA and Surgeon  Anesthesia Plan Comments:         Anesthesia Quick Evaluation

## 2017-01-15 NOTE — Anesthesia Postprocedure Evaluation (Signed)
Anesthesia Post Note  Patient: Sheila Spears  Procedure(s) Performed: Procedure(s) (LRB): RELEASE TRIGGER FINGER/A-1 PULLEY RIGHT RING FINGER (Right)  Patient location during evaluation: PACU Anesthesia Type: MAC and Bier Block Level of consciousness: awake Pain management: pain level controlled Vital Signs Assessment: post-procedure vital signs reviewed and stable Respiratory status: spontaneous breathing Cardiovascular status: stable Postop Assessment: no signs of nausea or vomiting Anesthetic complications: no        Last Vitals:  Vitals:   01/15/17 1124 01/15/17 1208  BP:  (!) 184/66  Pulse: (!) 50 60  Resp: 14 18  Temp:  36.4 C    Last Pain:  Vitals:   01/15/17 1208  PainSc: 0-No pain   Pain Goal: Patients Stated Pain Goal: 0 (01/15/17 0951)               Karanvir Balderston JR,JOHN Mateo Flow

## 2017-01-16 ENCOUNTER — Encounter (HOSPITAL_BASED_OUTPATIENT_CLINIC_OR_DEPARTMENT_OTHER): Payer: Self-pay | Admitting: Orthopedic Surgery

## 2017-02-06 DIAGNOSIS — M79644 Pain in right finger(s): Secondary | ICD-10-CM | POA: Insufficient documentation

## 2017-02-06 DIAGNOSIS — M65341 Trigger finger, right ring finger: Secondary | ICD-10-CM | POA: Diagnosis not present

## 2017-02-11 DIAGNOSIS — M65341 Trigger finger, right ring finger: Secondary | ICD-10-CM | POA: Diagnosis not present

## 2017-02-11 DIAGNOSIS — M79644 Pain in right finger(s): Secondary | ICD-10-CM | POA: Diagnosis not present

## 2017-04-01 ENCOUNTER — Ambulatory Visit: Payer: Medicare Other | Admitting: Podiatry

## 2017-04-08 ENCOUNTER — Encounter: Payer: Self-pay | Admitting: Podiatry

## 2017-04-08 ENCOUNTER — Ambulatory Visit (INDEPENDENT_AMBULATORY_CARE_PROVIDER_SITE_OTHER): Payer: Medicare Other | Admitting: Podiatry

## 2017-04-08 DIAGNOSIS — M79672 Pain in left foot: Secondary | ICD-10-CM

## 2017-04-08 DIAGNOSIS — B351 Tinea unguium: Secondary | ICD-10-CM

## 2017-04-08 DIAGNOSIS — M79671 Pain in right foot: Secondary | ICD-10-CM

## 2017-04-08 NOTE — Patient Instructions (Signed)
Seen for hypertrophic nails. All nails debrided. Return in 3 months or as needed.  

## 2017-04-08 NOTE — Progress Notes (Signed)
SUBJECTIVE: 83 y.o. year old female presents requesting painful toe nails trimmed. Denies any new problems.  Objective: Dermatologic:Thick yellow deformed nails x 10.  No abnormal skin lesions noted. Vascular:Pedal pulses are all palpable. No abnormal skin lesions. Orthopedic:S/P Bilateral hip replacement.  No gross osseous deformities noted. Neurologic:Epicritic sensations are grossly intact.   Assessment: Dystrophic mycotic nails x 10.  Pain in lower limbs.  Treatment: All mycotic nails debrided. Return in 3 months or as needed.

## 2017-04-09 DIAGNOSIS — I10 Essential (primary) hypertension: Secondary | ICD-10-CM | POA: Diagnosis not present

## 2017-04-09 DIAGNOSIS — R7301 Impaired fasting glucose: Secondary | ICD-10-CM | POA: Diagnosis not present

## 2017-04-14 DIAGNOSIS — R3129 Other microscopic hematuria: Secondary | ICD-10-CM | POA: Insufficient documentation

## 2017-04-14 DIAGNOSIS — M8580 Other specified disorders of bone density and structure, unspecified site: Secondary | ICD-10-CM | POA: Diagnosis not present

## 2017-04-14 DIAGNOSIS — I10 Essential (primary) hypertension: Secondary | ICD-10-CM | POA: Diagnosis not present

## 2017-04-14 DIAGNOSIS — R7301 Impaired fasting glucose: Secondary | ICD-10-CM | POA: Diagnosis not present

## 2017-04-14 DIAGNOSIS — D709 Neutropenia, unspecified: Secondary | ICD-10-CM | POA: Insufficient documentation

## 2017-06-17 DIAGNOSIS — Z961 Presence of intraocular lens: Secondary | ICD-10-CM | POA: Diagnosis not present

## 2017-06-17 DIAGNOSIS — H401123 Primary open-angle glaucoma, left eye, severe stage: Secondary | ICD-10-CM | POA: Diagnosis not present

## 2017-06-17 DIAGNOSIS — H401112 Primary open-angle glaucoma, right eye, moderate stage: Secondary | ICD-10-CM | POA: Diagnosis not present

## 2017-07-09 ENCOUNTER — Encounter: Payer: Self-pay | Admitting: Podiatry

## 2017-07-09 ENCOUNTER — Ambulatory Visit (INDEPENDENT_AMBULATORY_CARE_PROVIDER_SITE_OTHER): Payer: Medicare Other | Admitting: Podiatry

## 2017-07-09 DIAGNOSIS — M79671 Pain in right foot: Secondary | ICD-10-CM

## 2017-07-09 DIAGNOSIS — B351 Tinea unguium: Secondary | ICD-10-CM

## 2017-07-09 DIAGNOSIS — M79672 Pain in left foot: Secondary | ICD-10-CM

## 2017-07-09 NOTE — Progress Notes (Signed)
SUBJECTIVE: 83 y.o. year old female presents requesting painful toe nails trimmed. Denies any new problems.  Objective: Dermatologic:Thick yellow deformed nails x 10.  No abnormal skin lesions noted. Vascular:Pedal pulses are all palpable. No abnormal skin lesions. Orthopedic:S/P Bilateral hip replacement.  No gross osseous deformities noted. Neurologic:Epicritic sensations are grossly intact.   Assessment: Dystrophic mycotic nails x 10.  Pain in lower limbs.  Treatment: All mycotic nails debrided. Return in 3 months or as needed.

## 2017-07-09 NOTE — Patient Instructions (Signed)
Seen for hypertrophic nails. All nails debrided. Return in 3 months or as needed.  

## 2017-10-08 ENCOUNTER — Encounter: Payer: Self-pay | Admitting: Podiatry

## 2017-10-08 ENCOUNTER — Ambulatory Visit: Payer: Medicare Other | Admitting: Podiatry

## 2017-10-08 DIAGNOSIS — B351 Tinea unguium: Secondary | ICD-10-CM | POA: Diagnosis not present

## 2017-10-08 DIAGNOSIS — M79671 Pain in right foot: Secondary | ICD-10-CM

## 2017-10-08 DIAGNOSIS — M79672 Pain in left foot: Secondary | ICD-10-CM | POA: Diagnosis not present

## 2017-10-08 NOTE — Patient Instructions (Signed)
Seen for hypertrophic nails. All nails debrided. Return in 3 months or as needed.  

## 2017-10-08 NOTE — Progress Notes (Signed)
Subjective: 83 y.o. year old female patient presents complaining of painful nails. Patient requests toe nails, corns and calluses trimmed.   Hx of bilateral hip replacement.  Objective: Dermatologic: Thick yellow deformed nails x 10. Vascular: Pedal pulses are all palpable. Orthopedic: No gross deformities noted. Neurologic: All epicritic and tactile sensations grossly intact.  Assessment: Dystrophic mycotic nails x 10.  Treatment: All mycotic nails, corns, calluses debrided.  Return in 3 months or as needed.

## 2017-10-13 DIAGNOSIS — R7301 Impaired fasting glucose: Secondary | ICD-10-CM | POA: Diagnosis not present

## 2017-10-13 DIAGNOSIS — I10 Essential (primary) hypertension: Secondary | ICD-10-CM | POA: Diagnosis not present

## 2017-10-13 DIAGNOSIS — D709 Neutropenia, unspecified: Secondary | ICD-10-CM | POA: Diagnosis not present

## 2017-10-22 DIAGNOSIS — Z23 Encounter for immunization: Secondary | ICD-10-CM | POA: Diagnosis not present

## 2017-10-22 DIAGNOSIS — R7301 Impaired fasting glucose: Secondary | ICD-10-CM | POA: Diagnosis not present

## 2017-10-22 DIAGNOSIS — D709 Neutropenia, unspecified: Secondary | ICD-10-CM | POA: Diagnosis not present

## 2017-10-22 DIAGNOSIS — I10 Essential (primary) hypertension: Secondary | ICD-10-CM | POA: Diagnosis not present

## 2017-11-11 DIAGNOSIS — Z1231 Encounter for screening mammogram for malignant neoplasm of breast: Secondary | ICD-10-CM | POA: Diagnosis not present

## 2017-12-03 DIAGNOSIS — R928 Other abnormal and inconclusive findings on diagnostic imaging of breast: Secondary | ICD-10-CM | POA: Diagnosis not present

## 2017-12-15 ENCOUNTER — Emergency Department (HOSPITAL_COMMUNITY): Payer: Medicare Other

## 2017-12-15 ENCOUNTER — Emergency Department (HOSPITAL_COMMUNITY)
Admission: EM | Admit: 2017-12-15 | Discharge: 2017-12-15 | Disposition: A | Discharge status: Home / Self Care | Payer: Medicare Other | Attending: Emergency Medicine | Admitting: Emergency Medicine

## 2017-12-15 ENCOUNTER — Encounter (HOSPITAL_COMMUNITY): Payer: Self-pay | Admitting: Emergency Medicine

## 2017-12-15 DIAGNOSIS — S299XXA Unspecified injury of thorax, initial encounter: Secondary | ICD-10-CM | POA: Diagnosis present

## 2017-12-15 DIAGNOSIS — Y999 Unspecified external cause status: Secondary | ICD-10-CM | POA: Insufficient documentation

## 2017-12-15 DIAGNOSIS — J189 Pneumonia, unspecified organism: Secondary | ICD-10-CM | POA: Insufficient documentation

## 2017-12-15 DIAGNOSIS — Z79899 Other long term (current) drug therapy: Secondary | ICD-10-CM | POA: Insufficient documentation

## 2017-12-15 DIAGNOSIS — I10 Essential (primary) hypertension: Secondary | ICD-10-CM | POA: Insufficient documentation

## 2017-12-15 DIAGNOSIS — S2221XA Fracture of manubrium, initial encounter for closed fracture: Secondary | ICD-10-CM | POA: Diagnosis not present

## 2017-12-15 DIAGNOSIS — Y929 Unspecified place or not applicable: Secondary | ICD-10-CM | POA: Diagnosis not present

## 2017-12-15 DIAGNOSIS — Y939 Activity, unspecified: Secondary | ICD-10-CM | POA: Diagnosis not present

## 2017-12-15 DIAGNOSIS — R079 Chest pain, unspecified: Secondary | ICD-10-CM | POA: Diagnosis not present

## 2017-12-15 LAB — COMPREHENSIVE METABOLIC PANEL
ALT: 15 U/L (ref 14–54)
ANION GAP: 11 (ref 5–15)
AST: 28 U/L (ref 15–41)
Albumin: 3.4 g/dL — ABNORMAL LOW (ref 3.5–5.0)
Alkaline Phosphatase: 77 U/L (ref 38–126)
BUN: 13 mg/dL (ref 6–20)
CHLORIDE: 107 mmol/L (ref 101–111)
CO2: 23 mmol/L (ref 22–32)
Calcium: 9 mg/dL (ref 8.9–10.3)
Creatinine, Ser: 0.75 mg/dL (ref 0.44–1.00)
GFR calc Af Amer: >60 mL/min (ref >60)
GFR calc non Af Amer: >60 mL/min (ref >60)
Glucose, Bld: 107 mg/dL — ABNORMAL HIGH (ref 65–99)
POTASSIUM: 3.9 mmol/L (ref 3.5–5.1)
SODIUM: 141 mmol/L (ref 135–145)
TOTAL PROTEIN: 6 g/dL — AB (ref 6.5–8.1)
Total Bilirubin: 1 mg/dL (ref 0.3–1.2)

## 2017-12-15 LAB — CBC WITH DIFFERENTIAL/PLATELET
BASOS PCT: 0 %
Basophils Absolute: 0 10*3/uL (ref 0.0–0.1)
EOS ABS: 0.1 10*3/uL (ref 0.0–0.7)
Eosinophils Relative: 2 %
HCT: 41.1 % (ref 36.0–46.0)
Hemoglobin: 13.5 g/dL (ref 12.0–15.0)
Lymphocytes Relative: 36 %
Lymphs Abs: 1.3 10*3/uL (ref 0.7–4.0)
MCH: 28.8 pg (ref 26.0–34.0)
MCHC: 32.8 g/dL (ref 30.0–36.0)
MCV: 87.6 fL (ref 78.0–100.0)
MONO ABS: 0.2 10*3/uL (ref 0.1–1.0)
MONOS PCT: 6 %
NEUTROS PCT: 56 %
Neutro Abs: 2 10*3/uL (ref 1.7–7.7)
Platelets: 127 10*3/uL — ABNORMAL LOW (ref 150–400)
RBC: 4.69 MIL/uL (ref 3.87–5.11)
RDW: 13.9 % (ref 11.5–15.5)
WBC: 3.7 10*3/uL — ABNORMAL LOW (ref 4.0–10.5)

## 2017-12-15 MED ORDER — FENTANYL CITRATE (PF) 100 MCG/2ML IJ SOLN
25.0000 ug | Freq: Once | INTRAMUSCULAR | Status: AC
  Administered 2017-12-15: 25 ug via INTRAVENOUS
  Filled 2017-12-15: qty 2

## 2017-12-15 MED ORDER — OXYCODONE-ACETAMINOPHEN 5-325 MG PO TABS: 1.0000 | ORAL_TABLET | ORAL | 0 refills | Status: DC | PRN

## 2017-12-15 MED ORDER — AZITHROMYCIN 250 MG PO TABS: ORAL_TABLET | ORAL | 0 refills | Status: DC

## 2017-12-15 MED ORDER — AZITHROMYCIN 250 MG PO TABS
500.0000 mg | ORAL_TABLET | Freq: Once | ORAL | Status: AC
  Administered 2017-12-15: 500 mg via ORAL
  Filled 2017-12-15: qty 2

## 2017-12-15 MED ORDER — DOCUSATE SODIUM 100 MG PO CAPS: 100.0000 mg | ORAL_CAPSULE | Freq: Two times a day (BID) | ORAL | 0 refills | Status: DC

## 2017-12-15 MED ORDER — IOPAMIDOL (ISOVUE-300) INJECTION 61%
INTRAVENOUS | Status: AC
  Filled 2017-12-15: qty 75

## 2017-12-15 MED ORDER — IOPAMIDOL (ISOVUE-300) INJECTION 61%
INTRAVENOUS | Status: AC
  Administered 2017-12-15: 75 mL
  Filled 2017-12-15: qty 75
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
## 2017-12-15 NOTE — ED Notes (Signed)
Patient transported to CT 

## 2017-12-17 ENCOUNTER — Other Ambulatory Visit: Payer: Self-pay

## 2017-12-17 ENCOUNTER — Emergency Department (HOSPITAL_COMMUNITY)
Admission: EM | Admit: 2017-12-17 | Discharge: 2017-12-17 | Disposition: A | Discharge status: Home / Self Care | Payer: No Typology Code available for payment source | Attending: Physician Assistant | Admitting: Physician Assistant

## 2017-12-17 ENCOUNTER — Emergency Department (HOSPITAL_COMMUNITY): Payer: No Typology Code available for payment source

## 2017-12-17 ENCOUNTER — Encounter (HOSPITAL_COMMUNITY): Payer: Self-pay | Admitting: *Deleted

## 2017-12-17 DIAGNOSIS — Z029 Encounter for administrative examinations, unspecified: Secondary | ICD-10-CM | POA: Diagnosis not present

## 2017-12-17 DIAGNOSIS — I10 Essential (primary) hypertension: Secondary | ICD-10-CM | POA: Diagnosis not present

## 2017-12-17 DIAGNOSIS — R079 Chest pain, unspecified: Secondary | ICD-10-CM | POA: Diagnosis not present

## 2017-12-17 DIAGNOSIS — Z79899 Other long term (current) drug therapy: Secondary | ICD-10-CM | POA: Insufficient documentation

## 2017-12-17 DIAGNOSIS — S299XXA Unspecified injury of thorax, initial encounter: Secondary | ICD-10-CM | POA: Diagnosis not present

## 2017-12-17 LAB — URINALYSIS, ROUTINE W REFLEX MICROSCOPIC
Bilirubin Urine: NEGATIVE
Glucose, UA: NEGATIVE mg/dL
HGB URINE DIPSTICK: NEGATIVE
KETONES UR: NEGATIVE mg/dL
Leukocytes, UA: NEGATIVE
Nitrite: NEGATIVE
PROTEIN: 30 mg/dL — AB
Specific Gravity, Urine: 1.031 — ABNORMAL HIGH (ref 1.005–1.030)
pH: 5 (ref 5.0–8.0)
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
## 2017-12-17 NOTE — Discharge Instructions (Signed)
Please follow-up with primary care physician for any continued complaints.  Again low threshold for worrying about pneumonia so be sure that she is taking big deep breaths and if she has any fever cough return immediately for chest x-ray.

## 2017-12-17 NOTE — ED Notes (Signed)
Lab work, radiology results and vital signs reviewed, no critical results at this time, no change in acuity indicated. Will obtain lab work.

## 2017-12-17 NOTE — ED Notes (Addendum)
Family at bedside denied blood draw reporting that if they are not going to be able to get admitted for 3 days so that insurance will approve SNF placement then they feel that they ned to go home. EDP made aware and at bedside speaking with family about plan of care.

## 2017-12-17 NOTE — ED Notes (Signed)
Pr family want to see is she if she can be admitted so she is able to get more care.

## 2017-12-21 DIAGNOSIS — D709 Neutropenia, unspecified: Secondary | ICD-10-CM | POA: Diagnosis not present

## 2017-12-21 DIAGNOSIS — R7301 Impaired fasting glucose: Secondary | ICD-10-CM | POA: Diagnosis not present

## 2017-12-21 DIAGNOSIS — S2223XD Sternal manubrial dissociation, subsequent encounter for fracture with routine healing: Secondary | ICD-10-CM | POA: Insufficient documentation

## 2017-12-21 DIAGNOSIS — I10 Essential (primary) hypertension: Secondary | ICD-10-CM | POA: Diagnosis not present

## 2018-01-11 ENCOUNTER — Ambulatory Visit: Payer: Self-pay | Admitting: Podiatry

## 2018-02-23 ENCOUNTER — Encounter: Payer: Self-pay | Admitting: Podiatry

## 2018-02-23 ENCOUNTER — Ambulatory Visit: Payer: Medicare Other | Admitting: Podiatry

## 2018-02-23 DIAGNOSIS — M79671 Pain in right foot: Secondary | ICD-10-CM

## 2018-02-23 DIAGNOSIS — M79672 Pain in left foot: Secondary | ICD-10-CM

## 2018-02-23 DIAGNOSIS — B351 Tinea unguium: Secondary | ICD-10-CM | POA: Diagnosis not present

## 2018-02-23 NOTE — Patient Instructions (Signed)
Seen for hypertrophic nails. All nails debrided. Return in 3 months or as needed.  

## 2018-02-23 NOTE — Progress Notes (Signed)
Subjective: 84 y.o. year old female patient presents complaining of painful nails. Patient requests toe nails, corns and calluses trimmed.   History of bilateral hip replacement.  Objective: Dermatologic: Thick yellow deformed nails x 10. No abnormal skin lesions noted.  Vascular: Pedal pulses are all palpable. Orthopedic: No gross deformities. Neurologic: All epicritic and tactile sensations grossly intact.  Assessment: Dystrophic mycotic nails x 10. Pain in both feet.  Treatment: All mycotic nails debrided.  Return in 3 months or as needed.

## 2018-02-24 DIAGNOSIS — H401111 Primary open-angle glaucoma, right eye, mild stage: Secondary | ICD-10-CM | POA: Diagnosis not present

## 2018-02-24 DIAGNOSIS — Z961 Presence of intraocular lens: Secondary | ICD-10-CM | POA: Diagnosis not present

## 2018-02-24 DIAGNOSIS — H401123 Primary open-angle glaucoma, left eye, severe stage: Secondary | ICD-10-CM | POA: Diagnosis not present

## 2018-04-16 DIAGNOSIS — R7301 Impaired fasting glucose: Secondary | ICD-10-CM | POA: Diagnosis not present

## 2018-04-16 DIAGNOSIS — I10 Essential (primary) hypertension: Secondary | ICD-10-CM | POA: Diagnosis not present

## 2018-04-16 DIAGNOSIS — D709 Neutropenia, unspecified: Secondary | ICD-10-CM | POA: Diagnosis not present

## 2018-04-22 DIAGNOSIS — I10 Essential (primary) hypertension: Secondary | ICD-10-CM | POA: Diagnosis not present

## 2018-04-22 DIAGNOSIS — D709 Neutropenia, unspecified: Secondary | ICD-10-CM | POA: Diagnosis not present

## 2018-04-22 DIAGNOSIS — R7301 Impaired fasting glucose: Secondary | ICD-10-CM | POA: Diagnosis not present

## 2018-04-22 DIAGNOSIS — M8580 Other specified disorders of bone density and structure, unspecified site: Secondary | ICD-10-CM | POA: Diagnosis not present

## 2018-05-25 ENCOUNTER — Ambulatory Visit (INDEPENDENT_AMBULATORY_CARE_PROVIDER_SITE_OTHER): Payer: Medicare Other | Admitting: Podiatry

## 2018-05-25 DIAGNOSIS — M79672 Pain in left foot: Secondary | ICD-10-CM | POA: Diagnosis not present

## 2018-05-25 DIAGNOSIS — B351 Tinea unguium: Secondary | ICD-10-CM | POA: Diagnosis not present

## 2018-05-25 DIAGNOSIS — M79671 Pain in right foot: Secondary | ICD-10-CM

## 2018-05-26 ENCOUNTER — Encounter: Payer: Self-pay | Admitting: Podiatry

## 2018-05-26 NOTE — Patient Instructions (Signed)
Seen for hypertrophic nails. All nails debrided. Return in 3 months or as needed.  

## 2018-05-26 NOTE — Progress Notes (Signed)
Subjective: 84 y.o. year old female patient presents complaining of painful nails. Patient requests toe nails trimmed.  Patient is ambulatory and alert.  Objective: Dermatologic: Thick yellow deformed nails x 10. Vascular: Pedal pulses are all palpable. Orthopedic: No gross deformities. Neurologic: All epicritic and tactile sensations grossly intact.  Assessment: Dystrophic mycotic nails x 10. Painful toes.  Treatment: All mycotic nails debrided.  Return in 3 months or as needed.

## 2018-06-13 ENCOUNTER — Other Ambulatory Visit: Payer: Self-pay

## 2018-06-13 ENCOUNTER — Encounter (HOSPITAL_COMMUNITY): Payer: Self-pay | Admitting: Emergency Medicine

## 2018-06-13 ENCOUNTER — Ambulatory Visit (HOSPITAL_COMMUNITY)
Admission: EM | Admit: 2018-06-13 | Discharge: 2018-06-13 | Disposition: A | Discharge status: Home / Self Care | Payer: Medicare Other | Attending: Family Medicine | Admitting: Family Medicine

## 2018-06-13 DIAGNOSIS — M79672 Pain in left foot: Secondary | ICD-10-CM

## 2018-06-13 MED ORDER — PREDNISONE 20 MG PO TABS: 40.0000 mg | ORAL_TABLET | Freq: Every day | ORAL | 0 refills | Status: AC

## 2018-06-13 MED ORDER — DICLOFENAC SODIUM 1 % TD GEL: 2.0000 g | Freq: Four times a day (QID) | TRANSDERMAL | 0 refills | Status: DC

## 2018-06-13 NOTE — ED Provider Notes (Signed)
[facility]    CSN: [id number] Arrival date & time: 06/13/18  1243     History   Chief Complaint Chief Complaint  Patient presents with  . Foot Pain    HPI Sheila Spears is a 84 y.o. female.   84 year old female comes in with daughter for 1 day history of left foot pain. Pain is to the top of the foot, worse with weight bearing and movement of the foot. Denies pain at rest. Denies injury/trauma. Patient can still ambulate on own without difficulty. Denies numbness/tingling. Denies fever, chills, night sweats. Denies spreading erythema, increased warmth, swelling. Wrapped foot up with dressing, which slightly helped with the symptoms. Denies history of prednisone use.      Past Medical History:  Diagnosis Date  . Glaucoma   . Hypertension     Patient Active Problem List   Diagnosis Date Noted  . Onychomycosis 06/20/2015  . Pain in lower limb 06/20/2015  . Cellulitis of right upper extremity   . Cellulitis 05/08/2015  . Essential hypertension 05/08/2015  . Macular erythematous rash, nonblanching 05/08/2015    Past Surgical History:  Procedure Laterality Date  . ABDOMINAL HYSTERECTOMY    . TRIGGER FINGER RELEASE Right 01/15/2017   Procedure: RELEASE TRIGGER FINGER/A-1 PULLEY RIGHT RING FINGER;  Surgeon: Betha Loa, MD;  Location: [facility];  Service: Orthopedics;  Laterality: Right;    OB History   None      Home Medications    Prior to Admission medications   Medication Sig Start Date End Date Taking? Authorizing Provider  azithromycin (ZITHROMAX) 250 MG tablet Take 1 every day until finished. 12/16/17   Jacalyn Lefevre, MD  Calcium Citrate-Vitamin D 315-250 MG-UNIT TABS Take 1 tablet by mouth daily.    [provider]  diclofenac sodium (VOLTAREN) 1 % GEL Apply 2 g topically 4 (four) times daily. 06/13/18   Cathie Hoops, Rondalyn Belford V, PA-C  diltiazem (DILACOR XR) 180 MG 24 hr capsule Take 180 mg by mouth daily.    [provider]   docusate sodium (COLACE) 100 MG capsule Take 1 capsule (100 mg total) by mouth every 12 (twelve) hours. 12/15/17   Jacalyn Lefevre, MD  dorzolamide-timolol (COSOPT) 22.3-6.8 MG/ML ophthalmic solution Place 1 drop into the left eye 2 (two) times daily. 10/06/17   [provider]  ferrous sulfate 325 (65 FE) MG tablet Take 325 mg by mouth as needed (ONLY TAKES WHEN NEEDED, AD DIRECTED BY MD).     [provider]  latanoprost (XALATAN) 0.005 % ophthalmic solution Place 1 drop into both eyes at bedtime.     [provider]  Multiple Vitamin (MULTIVITAMIN WITH MINERALS) TABS tablet Take 1 tablet by mouth daily.    [provider]  Multiple Vitamins-Minerals (PRESERVISION AREDS) CAPS Take 1 capsule by mouth daily.    [provider]  Omega-3 Fatty Acids (FISH OIL PO) Take 1 capsule by mouth at bedtime.    [provider]  oxyCODONE-acetaminophen (PERCOCET/ROXICET) 5-325 MG tablet Take 1 tablet by mouth every 4 (four) hours as needed for severe pain. 12/15/17   Jacalyn Lefevre, MD  predniSONE (DELTASONE) 20 MG tablet Take 2 tablets (40 mg total) by mouth daily for 5 days. 06/13/18 06/18/18  Cathie Hoops, Remi Lopata V, PA-C  valsartan (DIOVAN) 320 MG tablet Take 320 mg by mouth daily.    [provider]    Family History Family History  Problem Relation Age of Onset  . Cancer - Colon  Mother     Social History Social History   Tobacco Use  . Smoking status: Never Smoker  . Smokeless tobacco: Never Used  Substance Use Topics  . Alcohol use: No    Alcohol/week: 0.0 oz  . Drug use: No     Allergies   Keflex [cephalexin]   Review of Systems Review of Systems  Reason unable to perform ROS: See HPI as above.     Physical Exam Triage Vital Signs ED Triage Vitals [06/13/18 1302]  Enc Vitals Group     BP (!) 177/61     Pulse Rate 64     Resp 16     Temp 98 F (36.7 C)     Temp Source Oral     SpO2 100 %     Weight      Height      Head  Circumference      Peak Flow      Pain Score 7     Pain Loc      Pain Edu?      Excl. in GC?    No data found.  Updated Vital Signs BP (!) 177/61 (BP Location: Left Arm)   Pulse 64   Temp 98 F (36.7 C) (Oral)   Resp 16   SpO2 100%   Physical Exam  Constitutional: She is oriented to person, place, and time. She appears well-developed and well-nourished. No distress.  HENT:  Head: Normocephalic and atraumatic.  Eyes: Pupils are equal, round, and reactive to light. Conjunctivae are normal.  Musculoskeletal:  Erythema across the dorsal foot, consistent with wear dressing was wrapped. Patient denies erythema prior to dressing.  No increased warmth, swelling, contusions. Tenderness to palpation along dorsal aspect of left foot, particularly of proximal 2nd and 3rd MTPs. Full ROM of ankle and toes. Strength normal and equal bilaterally. Sensation intact and equal bilaterally. Pedal pulse 2+, cap refill <2s  Neurological: She is alert and oriented to person, place, and time.  Skin: She is not diaphoretic.    UC Treatments / Results  Labs (all labs ordered are listed, but only abnormal results are displayed) Labs Reviewed - No data to display  EKG None  Radiology No results found.  Procedures Procedures (including critical care time)  Medications Ordered in UC Medications - No data to display  Initial Impression / Assessment and Plan / UC Course  I have reviewed the triage vital signs and the nursing notes.  Pertinent labs & imaging results that were available during my care of the patient were reviewed by me and considered in my medical decision making (see chart for details).    Patient cannot take NSAIDs due to stomach upset. Discussed voltaren vs prednisone as patient's daughter would like medicine by mouth. Patient would like to try voltaren gel first, and will start prednisone if symptoms not improving. Ice compress, elevation. Follow up with PCP if symptoms does  not improve.  Final Clinical Impressions(s) / UC Diagnoses   Final diagnoses:  Foot pain, right    ED Prescriptions    Medication Sig Dispense Auth. Provider   diclofenac sodium (VOLTAREN) 1 % GEL Apply 2 g topically 4 (four) times daily. 1 Tube Devesh Monforte V, PA-C   predniSONE (DELTASONE) 20 MG tablet Take 2 tablets (40 mg total) by mouth daily for 5 days. 10 tablet Threasa AlphaYu, Augusten Lipkin V, PA-C        Taj Nevins V, PA-C 06/13/18 1406

## 2018-06-13 NOTE — ED Triage Notes (Signed)
[facility] with pain to the top of left foot when she walks that started yesterday am. The patient denied any known injury.

## 2018-06-13 NOTE — Discharge Instructions (Signed)
As discussed, symptoms most likely due to inflammation. Start voltaren gel, ice compress, elevation as directed. If symptoms not improving, you can take prednisone. Follow up with PCP for further evaluation if symptoms not improving.

## 2018-08-26 DIAGNOSIS — Z961 Presence of intraocular lens: Secondary | ICD-10-CM | POA: Diagnosis not present

## 2018-08-26 DIAGNOSIS — H401123 Primary open-angle glaucoma, left eye, severe stage: Secondary | ICD-10-CM | POA: Diagnosis not present

## 2018-08-31 ENCOUNTER — Encounter: Payer: Self-pay | Admitting: Podiatry

## 2018-08-31 ENCOUNTER — Ambulatory Visit: Payer: Medicare Other | Admitting: Podiatry

## 2018-08-31 DIAGNOSIS — M79672 Pain in left foot: Secondary | ICD-10-CM | POA: Diagnosis not present

## 2018-08-31 DIAGNOSIS — M79671 Pain in right foot: Secondary | ICD-10-CM

## 2018-08-31 DIAGNOSIS — B351 Tinea unguium: Secondary | ICD-10-CM

## 2018-08-31 NOTE — Patient Instructions (Signed)
Seen for hypertrophic nails. All nails debrided. Return as needed.  

## 2018-08-31 NOTE — Progress Notes (Signed)
Subjective: 84 y.o. year old female patient presents complaining of painful nails. Patient requests toe nails trimmed. Denies any new problems.  Objective: Dermatologic: Thick yellow deformed nails x 10. Painful hallucal nail on both borders. Vascular: Pedal pulses are all palpable. Orthopedic: No gross deformities. Neurologic: All epicritic and tactile sensations grossly intact.  Assessment: Dystrophic mycotic nails x 10. Painful big toe nails.  Treatment: All mycotic nails debrided.  Return needed.

## 2018-10-11 DIAGNOSIS — R7301 Impaired fasting glucose: Secondary | ICD-10-CM | POA: Diagnosis not present

## 2018-10-11 DIAGNOSIS — D709 Neutropenia, unspecified: Secondary | ICD-10-CM | POA: Diagnosis not present

## 2018-10-11 DIAGNOSIS — I10 Essential (primary) hypertension: Secondary | ICD-10-CM | POA: Diagnosis not present

## 2018-10-15 DIAGNOSIS — D709 Neutropenia, unspecified: Secondary | ICD-10-CM | POA: Diagnosis not present

## 2018-10-15 DIAGNOSIS — M8580 Other specified disorders of bone density and structure, unspecified site: Secondary | ICD-10-CM | POA: Diagnosis not present

## 2018-10-15 DIAGNOSIS — I10 Essential (primary) hypertension: Secondary | ICD-10-CM | POA: Diagnosis not present

## 2018-10-15 DIAGNOSIS — R7301 Impaired fasting glucose: Secondary | ICD-10-CM | POA: Diagnosis not present

## 2018-10-19 DIAGNOSIS — Z23 Encounter for immunization: Secondary | ICD-10-CM | POA: Diagnosis not present

## 2018-12-07 DIAGNOSIS — D709 Neutropenia, unspecified: Secondary | ICD-10-CM | POA: Diagnosis not present

## 2018-12-07 DIAGNOSIS — M8580 Other specified disorders of bone density and structure, unspecified site: Secondary | ICD-10-CM | POA: Diagnosis not present

## 2018-12-07 DIAGNOSIS — R7301 Impaired fasting glucose: Secondary | ICD-10-CM | POA: Diagnosis not present

## 2018-12-07 DIAGNOSIS — I10 Essential (primary) hypertension: Secondary | ICD-10-CM | POA: Diagnosis not present

## 2018-12-15 ENCOUNTER — Ambulatory Visit (HOSPITAL_COMMUNITY)
Admission: RE | Admit: 2018-12-15 | Discharge: 2018-12-15 | Disposition: A | Discharge status: Home / Self Care | Payer: Medicare Other | Source: Ambulatory Visit | Attending: Family Medicine | Admitting: Family Medicine

## 2018-12-15 ENCOUNTER — Encounter (HOSPITAL_COMMUNITY): Payer: Self-pay | Admitting: Emergency Medicine

## 2018-12-15 ENCOUNTER — Ambulatory Visit (HOSPITAL_COMMUNITY)
Admission: EM | Admit: 2018-12-15 | Discharge: 2018-12-15 | Disposition: A | Discharge status: Home / Self Care | Payer: BC Managed Care – PPO | Attending: Family Medicine | Admitting: Family Medicine

## 2018-12-15 DIAGNOSIS — M7989 Other specified soft tissue disorders: Secondary | ICD-10-CM

## 2018-12-15 DIAGNOSIS — M79605 Pain in left leg: Secondary | ICD-10-CM | POA: Insufficient documentation

## 2018-12-15 DIAGNOSIS — M79662 Pain in left lower leg: Secondary | ICD-10-CM | POA: Diagnosis not present

## 2018-12-15 DIAGNOSIS — M79609 Pain in unspecified limb: Secondary | ICD-10-CM | POA: Diagnosis not present

## 2018-12-15 NOTE — Progress Notes (Signed)
Left lower extremity venous duplex has been completed.   Preliminary results in CV Proc.   Blanch Media 12/15/2018 4:36 PM

## 2018-12-15 NOTE — Discharge Instructions (Signed)
You need a venous doppler (ultrasound) to rule out clot in the left leg.

## 2018-12-15 NOTE — ED Provider Notes (Signed)
[facility]    CSN: [id number] Arrival date & time: 12/15/18  1018     History   Chief Complaint Chief Complaint  Patient presents with  . Leg Pain    HPI Sheila Spears is a 85 y.o. female.   This is a 85 year old woman with left leg pain for the last 5 days.  She is an established patient, having been seen for cellulitis on her legs in the past.     Past Medical History:  Diagnosis Date  . Glaucoma   . Hypertension     Patient Active Problem List   Diagnosis Date Noted  . Onychomycosis 06/20/2015  . Pain in lower limb 06/20/2015  . Cellulitis of right upper extremity   . Cellulitis 05/08/2015  . Essential hypertension 05/08/2015  . Macular erythematous rash, nonblanching 05/08/2015    Past Surgical History:  Procedure Laterality Date  . ABDOMINAL HYSTERECTOMY    . TRIGGER FINGER RELEASE Right 01/15/2017   Procedure: RELEASE TRIGGER FINGER/A-1 PULLEY RIGHT RING FINGER;  Surgeon: Betha Loa, MD;  Location: [facility];  Service: Orthopedics;  Laterality: Right;    OB History   No obstetric history on file.      Home Medications    Prior to Admission medications   Medication Sig Start Date End Date Taking? Authorizing Provider  Calcium Citrate-Vitamin D 315-250 MG-UNIT TABS Take 1 tablet by mouth daily.    [provider]  diclofenac sodium (VOLTAREN) 1 % GEL Apply 2 g topically 4 (four) times daily. 06/13/18   Cathie Hoops, Amy V, PA-C  diltiazem (DILACOR XR) 180 MG 24 hr capsule Take 180 mg by mouth daily.    [provider]  docusate sodium (COLACE) 100 MG capsule Take 1 capsule (100 mg total) by mouth every 12 (twelve) hours. 12/15/17   Jacalyn Lefevre, MD  dorzolamide-timolol (COSOPT) 22.3-6.8 MG/ML ophthalmic solution Place 1 drop into the left eye 2 (two) times daily. 10/06/17   [provider]  ferrous sulfate 325 (65 FE) MG tablet Take 325 mg by mouth as needed (ONLY TAKES WHEN NEEDED, AD DIRECTED BY MD).      [provider]  latanoprost (XALATAN) 0.005 % ophthalmic solution Place 1 drop into both eyes at bedtime.     [provider]  Multiple Vitamin (MULTIVITAMIN WITH MINERALS) TABS tablet Take 1 tablet by mouth daily.    [provider]  Multiple Vitamins-Minerals (PRESERVISION AREDS) CAPS Take 1 capsule by mouth daily.    [provider]  Omega-3 Fatty Acids (FISH OIL PO) Take 1 capsule by mouth at bedtime.    [provider]  oxyCODONE-acetaminophen (PERCOCET/ROXICET) 5-325 MG tablet Take 1 tablet by mouth every 4 (four) hours as needed for severe pain. 12/15/17   Jacalyn Lefevre, MD  valsartan (DIOVAN) 320 MG tablet Take 320 mg by mouth daily.    [provider]    Family History Family History  Problem Relation Age of Onset  . Cancer - Colon Mother     Social History Social History   Tobacco Use  . Smoking status: Never Smoker  . Smokeless tobacco: Never Used  Substance Use Topics  . Alcohol use: No    Alcohol/week: 0.0 standard drinks  . Drug use: No     Allergies   Keflex [cephalexin]   Review of Systems Review of Systems  Respiratory: Negative.   Cardiovascular: Negative.      Physical Exam Triage Vital Signs ED Triage Vitals [12/15/18 1134]  Enc Vitals Group     BP (!) 149/65     Pulse Rate 72     Resp 18     Temp 98.3 F (36.8 C)     Temp Source Temporal     SpO2 99 %     Weight      Height      Head Circumference      Peak Flow      Pain Score 7     Pain Loc      Pain Edu?      Excl. in GC?    No data found.  Updated Vital Signs BP (!) 149/65 (BP Location: Right Arm)   Pulse 72   Temp 98.3 F (36.8 C) (Temporal)   Resp 18   SpO2 99%    Physical Exam Vitals signs and nursing note reviewed.  Constitutional:      Appearance: Normal appearance.  Musculoskeletal: Normal range of motion.     Comments: Mildly tender left mid calf with no swelling, warmth or redness  Skin:    General:  Skin is warm and dry.     Findings: No erythema.  Neurological:     General: No focal deficit present.     Mental Status: She is alert and oriented to person, place, and time.  Psychiatric:        Mood and Affect: Mood normal.      UC Treatments / Results  Labs (all labs ordered are listed, but only abnormal results are displayed) Labs Reviewed - No data to display  EKG None  Radiology No results found.  Procedures Procedures (including critical care time)  Medications Ordered in UC Medications - No data to display  Initial Impression / Assessment and Plan / UC Course  I have reviewed the triage vital signs and the nursing notes.  Pertinent labs & imaging results that were available during my care of the patient were reviewed by me and considered in my medical decision making (see chart for details).    Final Clinical Impressions(s) / UC Diagnoses   Final diagnoses:  Left leg pain     Discharge Instructions     You need a venous doppler (ultrasound) to rule out clot in the left leg.    ED Prescriptions    None     Controlled Substance Prescriptions [place] Controlled Substance Registry consulted? Not Applicable   Elvina Sidle, MD 12/15/18 1213

## 2018-12-15 NOTE — ED Notes (Addendum)
Patient able to ambulate independently.  Appointment made with vascular at 1630 today.  Patient made aware.

## 2018-12-15 NOTE — ED Triage Notes (Signed)
Pt sts left sided leg pain x 5 days

## 2018-12-23 DIAGNOSIS — Z1231 Encounter for screening mammogram for malignant neoplasm of breast: Secondary | ICD-10-CM | POA: Diagnosis not present

## 2019-01-12 DIAGNOSIS — M65332 Trigger finger, left middle finger: Secondary | ICD-10-CM | POA: Diagnosis not present

## 2019-01-19 ENCOUNTER — Ambulatory Visit (HOSPITAL_COMMUNITY)
Admission: EM | Admit: 2019-01-19 | Discharge: 2019-01-19 | Disposition: A | Discharge status: Home / Self Care | Payer: Medicare Other | Attending: Family Medicine | Admitting: Family Medicine

## 2019-01-19 ENCOUNTER — Encounter (HOSPITAL_COMMUNITY): Payer: Self-pay | Admitting: Emergency Medicine

## 2019-01-19 DIAGNOSIS — M79605 Pain in left leg: Secondary | ICD-10-CM

## 2019-01-19 MED ORDER — PREDNISONE 10 MG (21) PO TBPK: ORAL_TABLET | Freq: Every day | ORAL | 0 refills | Status: DC

## 2019-01-19 NOTE — ED Triage Notes (Signed)
Pt here for left ankle and foot pain

## 2019-01-19 NOTE — ED Provider Notes (Signed)
[facility]   [id number] 01/19/19 Arrival Time: 1038  ASSESSMENT & PLAN:  1. Left leg pain    Meds ordered this encounter  Medications  . predniSONE (STERAPRED UNI-PAK 21 TAB) 10 MG (21) TBPK tablet    Sig: Take by mouth daily. Take as directed.    Dispense:  21 tablet    Refill:  0   Continue Tylenol as needed. Trial of prednisone. She does not have a PCP. She will plan to schedule f/u with an orthopaedist; prefers to defer any imaging until seeing them.  Follow-up Information    Schedule an appointment as soon as possible for a visit  with [facility], [facility].   Specialty:  Specialist Contact information: [address] [place] [address] [phone]        Schedule an appointment as soon as possible for a visit  with Specialists, [facility].   Specialty:  Orthopedic Surgery Contact information: [facility] [address] [place] [address] [phone]          May f/u here as needed.  Reviewed expectations re: course of current medical issues. Questions answered. Outlined signs and symptoms indicating need for more acute intervention. Patient verbalized understanding. After Visit Summary given.  SUBJECTIVE: History from: patient. Sheila Spears is a 85 y.o. female who reports intermittent pain of her left lower leg. First noticed approx six months ago; given prednisone; resolved. Two to three recurrences since then. Seen here on 12/15/2018; "about the same". Sent for U/S to r/o DVT; negative study. Pain has continued. No swelling/edema reported. Describes aching pain from her mid calf to her ankle and dorsal foot. No trauma/injury reported. Ambulatory without difficulty. Able to wear her normal shoes. Tylenol provides mild and temporary relief. Pain does not wake her at night. Occasional "tingling sensation" of her foot; none currently. No extremity sensation changes or weakness. Aggravating  factors: weight bearing. Alleviating factors: rest. Associated symptoms: none reported.  Past Surgical History:  Procedure Laterality Date  . ABDOMINAL HYSTERECTOMY    . TRIGGER FINGER RELEASE Right 01/15/2017   Procedure: RELEASE TRIGGER FINGER/A-1 PULLEY RIGHT RING FINGER;  Surgeon: Betha Loa, MD;  Location: [facility];  Service: Orthopedics;  Laterality: Right;     ROS: As per HPI. All other systems negative.   OBJECTIVE:  Vitals:   01/19/19 1137  BP: (!) 153/85  Pulse: 84  Resp: 18  Temp: 97.8 F (36.6 C)  TempSrc: Temporal  SpO2: 100%    General appearance: alert; no distress HEENT: [place]; AT Abd: soft Extremities: . LLE: warm and well perfused; poorly localized mild tenderness over left medial/anterior leg; without gross deformities; with no swelling; with no bruising; ROM: normal CV: brisk extremity capillary refill of LLE; 1+ DP and PT pulse of LLE. Skin: warm and dry; no visible rashes Neurologic: gait normal; normal reflexes of RLE and LLE; normal sensation of RLE and LLE; normal strength of RLE and LLE Psychological: alert and cooperative; normal mood and affect   Allergies  Allergen Reactions  . Keflex [Cephalexin] Rash    Past Medical History:  Diagnosis Date  . Glaucoma   . Hypertension    Social History   Socioeconomic History  . Marital status: Widowed    Spouse name: Not on file  . Number of children: Not on file  . Years of education: Not on file  . Highest education level: Not on file  Occupational History  . Not on file  Social Needs  . Financial resource strain: Not on file  . Food insecurity:    Worry: Not on file    Inability: Not on file  . Transportation needs:    Medical: Not on file    Non-medical: Not on file  Tobacco Use  . Smoking status: Never Smoker  . Smokeless tobacco: Never Used  Substance and Sexual Activity  . Alcohol use: No    Alcohol/week: 0.0 standard drinks  . Drug use: No  . Sexual  activity: Not on file  Lifestyle  . Physical activity:    Days per week: Not on file    Minutes per session: Not on file  . Stress: Not on file  Relationships  . Social connections:    Talks on phone: Not on file    Gets together: Not on file    Attends religious service: Not on file    Active member of club or organization: Not on file    Attends meetings of clubs or organizations: Not on file    Relationship status: Not on file  Other Topics Concern  . Not on file  Social History Narrative  . Not on file   Family History  Problem Relation Age of Onset  . Cancer - Colon Mother    Past Surgical History:  Procedure Laterality Date  . ABDOMINAL HYSTERECTOMY    . TRIGGER FINGER RELEASE Right 01/15/2017   Procedure: RELEASE TRIGGER FINGER/A-1 PULLEY RIGHT RING FINGER;  Surgeon: Betha Loa, MD;  Location: [facility];  Service: Orthopedics;  Laterality: Right;      Mardella Layman, MD 01/22/19 1007

## 2019-01-26 IMAGING — CT CT CHEST W/ CM
2 of 3 series · 15 of 36 positions shown, 18 images · IV contrast (Omni 300)
Comparison: Chest x-ray 09/16/2011

CLINICAL DATA: Midsternal chest pain upon palpation.

EXAM:
CT CHEST WITH CONTRAST
TECHNIQUE: Multidetector CT imaging of the chest was performed during
intravenous contrast administration.
CONTRAST:  75mL [id number] IOPAMIDOL ([id number]) INJECTION 61%

[Series 3: chest with 2mm st · axial · 0.63mm/px · z∈[+1076,+1328]mm · 12 of 150 slices shown, 15 images]
[im 12/150  mediastinal]
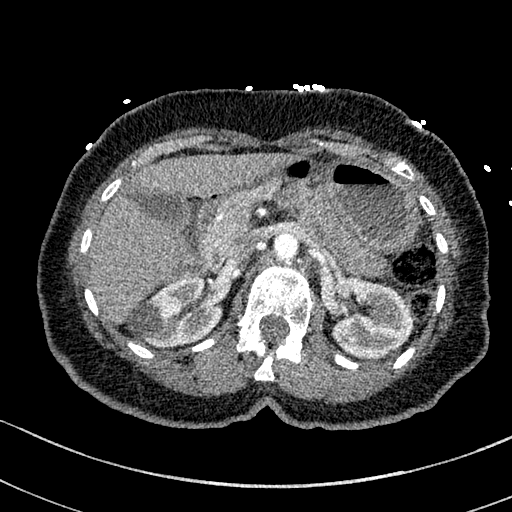
[im 12/150  lung]
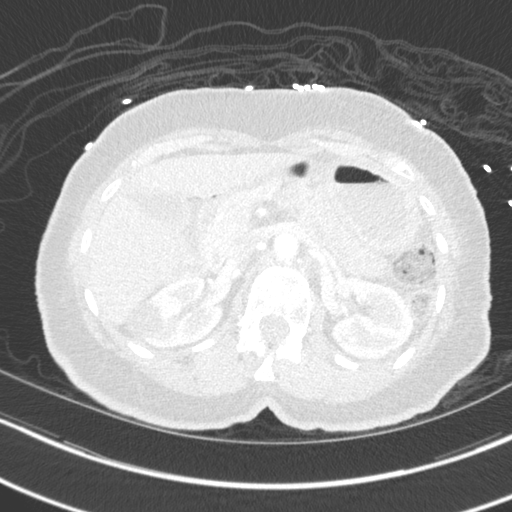
[im 23/150  lung]
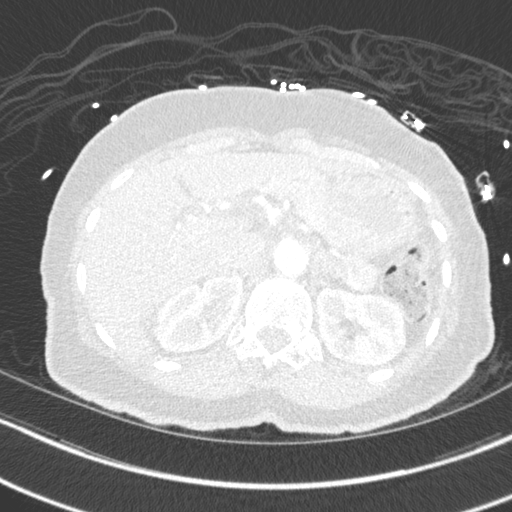
[im 34/150  lung]
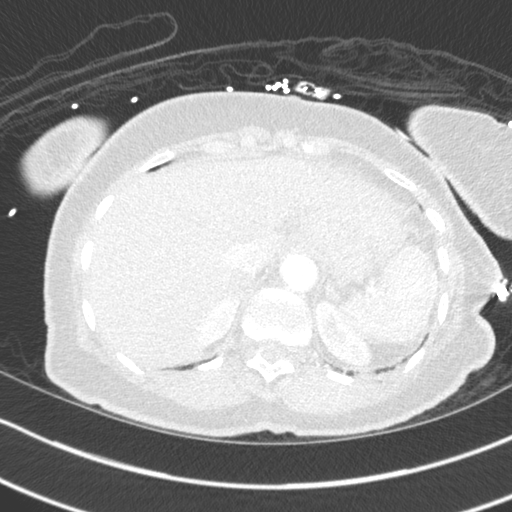
[im 45/150  lung]
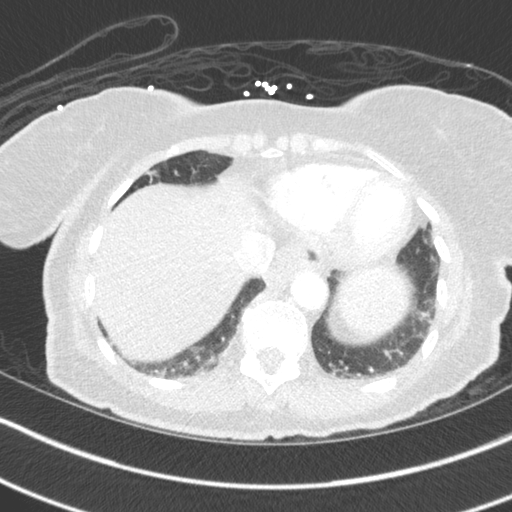
[im 56/150  mediastinal]
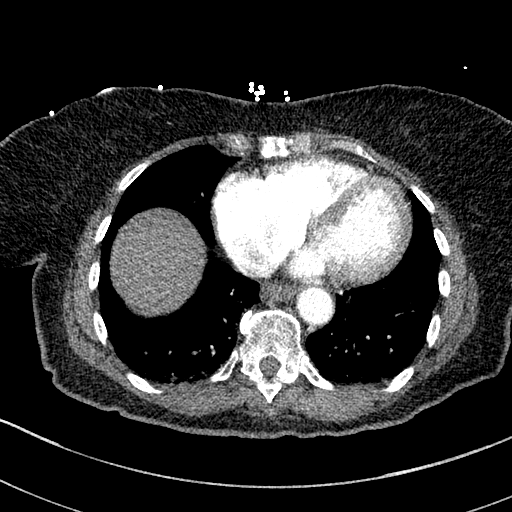
[im 56/150  lung]
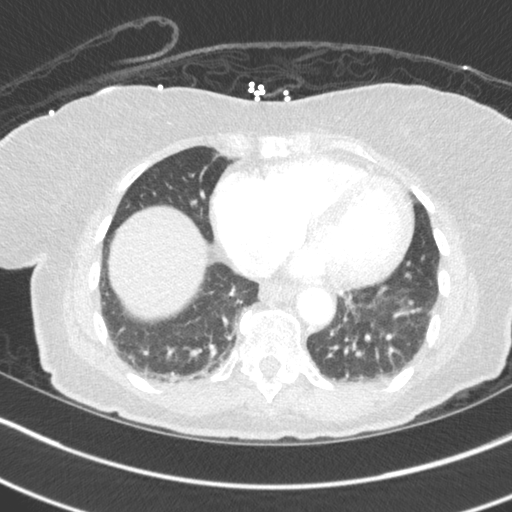
[im 67/150  lung]
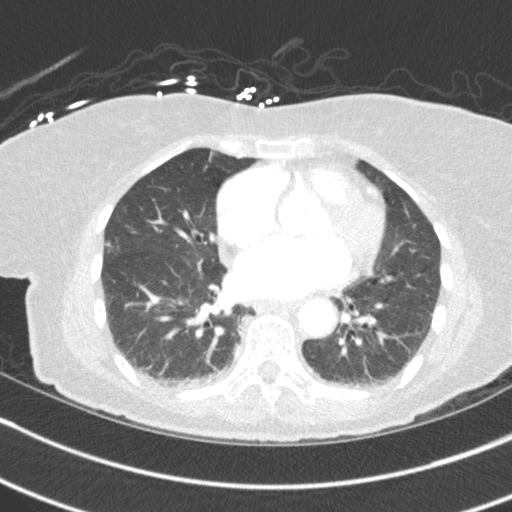
[im 83/150  lung]
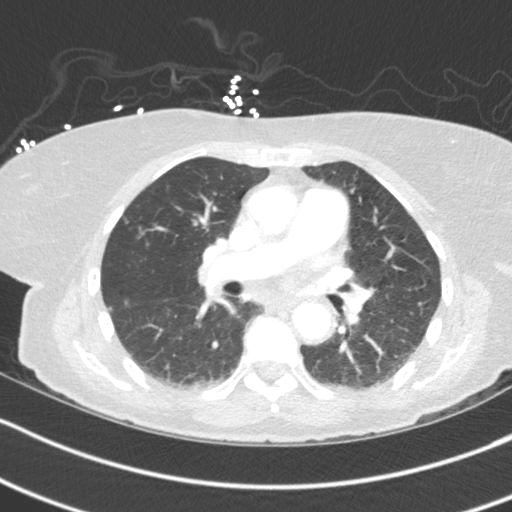
[im 94/150  lung]
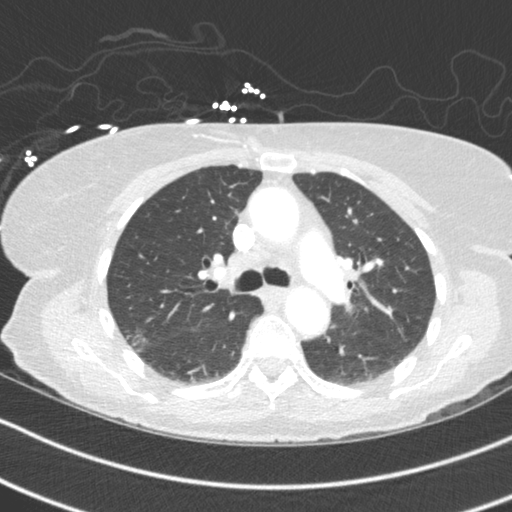
[im 105/150  mediastinal]
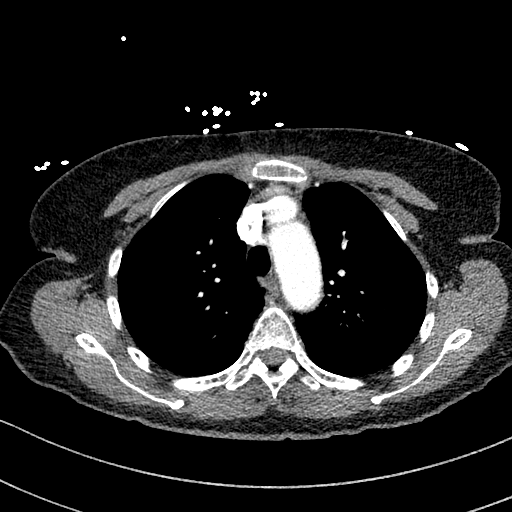
[im 105/150  lung]
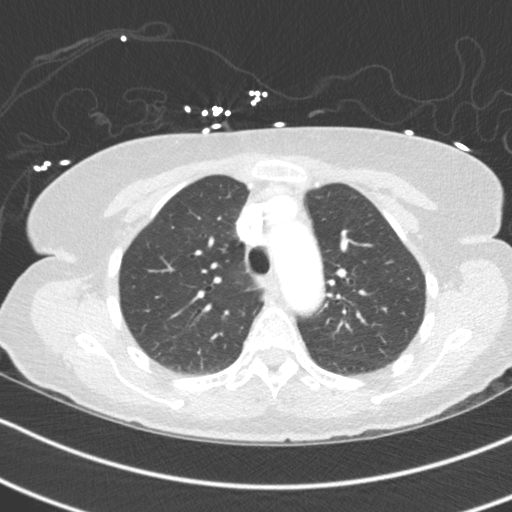
[im 116/150  lung]
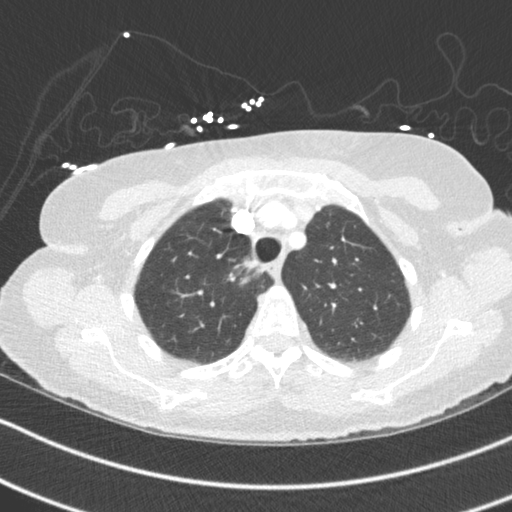
[im 127/150  lung]
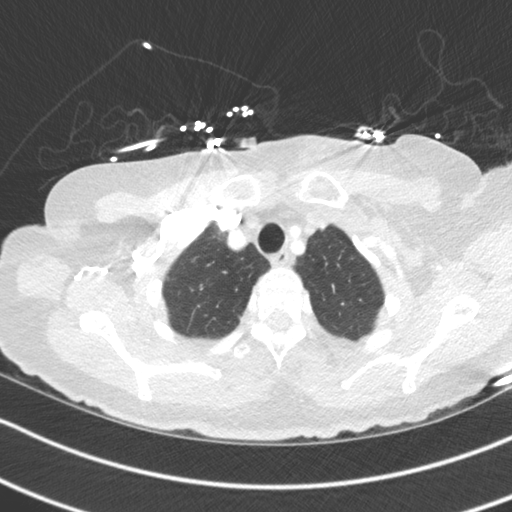
[im 138/150  lung]
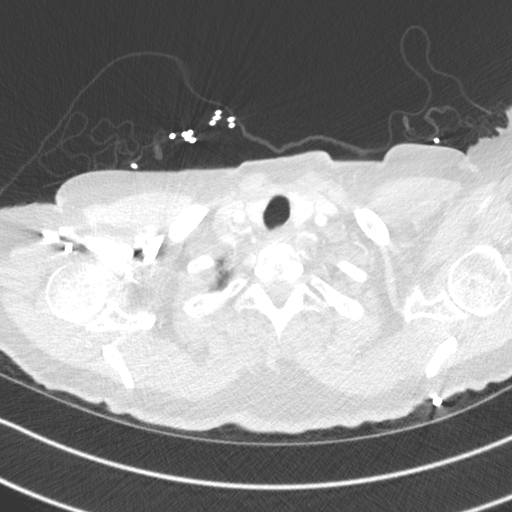

[Series 6: chest with 3mm st cor · coronal · 0.59mm/px · 3 of 77 slices shown]
[im 16/77  lung]
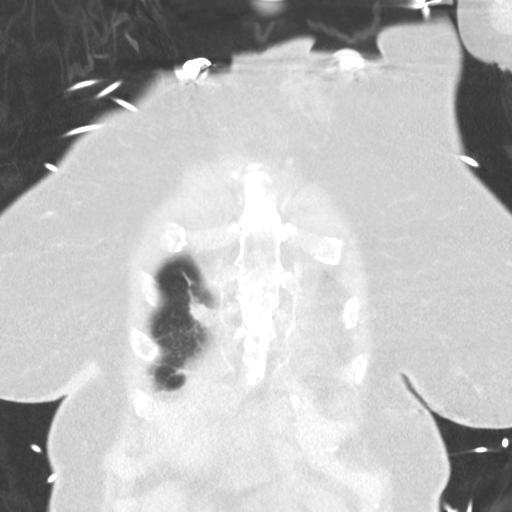
[im 31/77  lung]
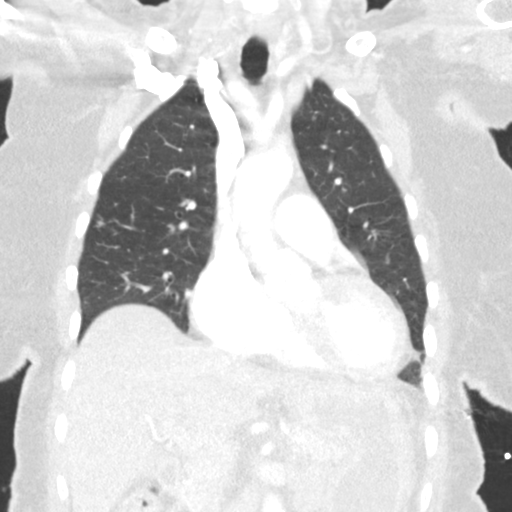
[im 46/77  lung]
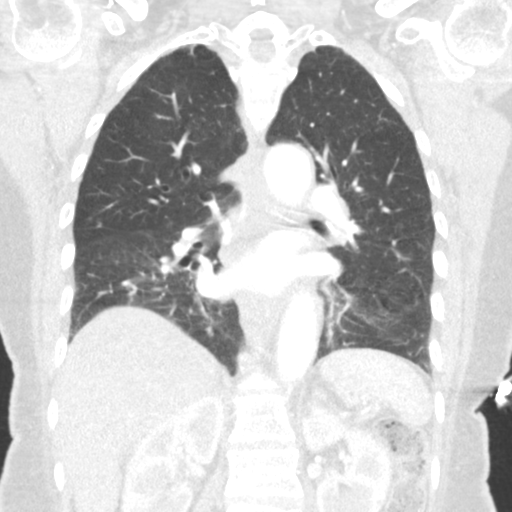

[15 of 36 positions shown; findings below may reference images not displayed]

FINDINGS: Cardiovascular: Heart is normal size. Mild calcified plaque over the
left main coronary artery. Thoracic aorta is within normal.
Visualized pulmonary arteries are within normal.

Mediastinum/Nodes: No evidence of mediastinal hemorrhage. No
significant mediastinal or hilar adenopathy. Remaining mediastinal
structures are within normal.

Lungs/Pleura: Lungs are adequately inflated demonstrate a rounded
focus of airspace density over the medial right upper lobe measuring
1.4 x 1.8 cm. There is mild patchy nodular opacification over the
right mid to lower lung with more discrete 5-6 mm nodule over the
right upper lobe just above the minor fissure. Subtle patchy
peripheral nodule opacification over the left mid to lower lung. No
effusion. Airways are normal.

Upper Abdomen: Bilateral renal cysts. Couple small hypodensities
over the dome of the liver too small to characterize but likely
cysts or hemangiomas.

Musculoskeletal: Findings suggest a subtle nondisplaced horizontal
fracture involving the sternal manubrium. Unremarkable.
IMPRESSION: Findings suggesting a subtle nondisplaced fracture of the sternal
manubrium.

Patchy nodular bilateral airspace process white worse than left
which may be due to infection versus atypical infection or
inflammatory process. Recommend follow-up chest CT 4-6 weeks.

Mild atherosclerotic coronary artery disease.

Bilateral renal cysts. Two small liver hypodensities too small to
characterize but likely cysts or hemangiomas.
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
## 2019-02-03 DIAGNOSIS — M79605 Pain in left leg: Secondary | ICD-10-CM | POA: Diagnosis not present

## 2019-02-03 DIAGNOSIS — M79672 Pain in left foot: Secondary | ICD-10-CM | POA: Insufficient documentation

## 2019-04-12 DIAGNOSIS — D709 Neutropenia, unspecified: Secondary | ICD-10-CM | POA: Diagnosis not present

## 2019-04-12 DIAGNOSIS — I10 Essential (primary) hypertension: Secondary | ICD-10-CM | POA: Diagnosis not present

## 2019-04-12 DIAGNOSIS — R7301 Impaired fasting glucose: Secondary | ICD-10-CM | POA: Diagnosis not present

## 2019-04-12 DIAGNOSIS — M8580 Other specified disorders of bone density and structure, unspecified site: Secondary | ICD-10-CM | POA: Diagnosis not present

## 2019-04-13 DIAGNOSIS — H401123 Primary open-angle glaucoma, left eye, severe stage: Secondary | ICD-10-CM | POA: Diagnosis not present

## 2019-04-13 DIAGNOSIS — Z961 Presence of intraocular lens: Secondary | ICD-10-CM | POA: Diagnosis not present

## 2019-04-13 DIAGNOSIS — H401111 Primary open-angle glaucoma, right eye, mild stage: Secondary | ICD-10-CM | POA: Diagnosis not present

## 2019-04-26 ENCOUNTER — Ambulatory Visit: Payer: Medicare Other | Admitting: Sports Medicine

## 2019-05-10 ENCOUNTER — Ambulatory Visit (INDEPENDENT_AMBULATORY_CARE_PROVIDER_SITE_OTHER): Payer: Medicare Other

## 2019-05-10 ENCOUNTER — Other Ambulatory Visit: Payer: Self-pay

## 2019-05-10 ENCOUNTER — Ambulatory Visit (INDEPENDENT_AMBULATORY_CARE_PROVIDER_SITE_OTHER): Payer: Medicare Other | Admitting: Sports Medicine

## 2019-05-10 ENCOUNTER — Encounter: Payer: Self-pay | Admitting: Sports Medicine

## 2019-05-10 ENCOUNTER — Other Ambulatory Visit: Payer: Self-pay | Admitting: Sports Medicine

## 2019-05-10 VITALS — Temp 97.1°F

## 2019-05-10 DIAGNOSIS — M7752 Other enthesopathy of left foot: Secondary | ICD-10-CM | POA: Diagnosis not present

## 2019-05-10 DIAGNOSIS — M79672 Pain in left foot: Secondary | ICD-10-CM

## 2019-05-10 DIAGNOSIS — M25572 Pain in left ankle and joints of left foot: Secondary | ICD-10-CM

## 2019-05-10 DIAGNOSIS — M779 Enthesopathy, unspecified: Secondary | ICD-10-CM

## 2019-05-10 MED ORDER — PREDNISONE 10 MG (21) PO TBPK: ORAL_TABLET | Freq: Every day | ORAL | 0 refills | Status: DC

## 2019-05-10 NOTE — Progress Notes (Signed)
Subjective:  Sheila Spears is a 85 y.o. female patient who presents to office for evaluation of Left ankle pain that radiates to calf. Patient complains of continued pain in the ankle since Feb. Patient has tried prednisone given at Urgent care that helped but slowly the pain has come back. Patient denies any other pedal complaints. Denies injury/trip/fall/sprain/any causative factors. Admits yesterday she stumble over dogs and hurt her arm but not other issues noted. No other issues.   Review of Systems  Musculoskeletal: Positive for joint pain and myalgias.  All other systems reviewed and are negative.    Patient Active Problem List   Diagnosis Date Noted  . Pain in left foot 02/03/2019  . Trigger middle finger of left hand 01/12/2019  . Closed sternal manubrial dissociation fracture with routine healing 12/21/2017  . Impaired fasting glucose 04/14/2017  . Microscopic hematuria 04/14/2017  . Neutropenia ([place]) 04/14/2017  . Osteopenia 04/14/2017  . Pain in finger of right hand 02/06/2017  . Onychomycosis 06/20/2015  . Pain in lower limb 06/20/2015  . Cellulitis of right upper extremity   . Cellulitis 05/08/2015  . Essential hypertension 05/08/2015  . Macular erythematous rash, nonblanching 05/08/2015    Current Outpatient Medications on File Prior to Visit  Medication Sig Dispense Refill  . Calcium Citrate-Vitamin D 315-250 MG-UNIT TABS Take 1 tablet by mouth daily.    . diclofenac sodium (VOLTAREN) 1 % GEL Apply 2 g topically 4 (four) times daily. 1 Tube 0  . diltiazem (DILACOR XR) 180 MG 24 hr capsule Take 180 mg by mouth daily.    Marland Kitchen docusate sodium (COLACE) 100 MG capsule Take 1 capsule (100 mg total) by mouth every 12 (twelve) hours. 60 capsule 0  . dorzolamide-timolol (COSOPT) 22.3-6.8 MG/ML ophthalmic solution Place 1 drop into the left eye 2 (two) times daily.  11  . ferrous sulfate 325 (65 FE) MG tablet Take 325 mg by mouth as needed (ONLY TAKES WHEN NEEDED, AD DIRECTED  BY MD).     Marland Kitchen latanoprost (XALATAN) 0.005 % ophthalmic solution Place 1 drop into both eyes at bedtime.     . Multiple Vitamin (MULTIVITAMIN WITH MINERALS) TABS tablet Take 1 tablet by mouth daily.    . Multiple Vitamins-Minerals (PRESERVISION AREDS PO) Take by mouth.    . Multiple Vitamins-Minerals (PRESERVISION AREDS) CAPS Take 1 capsule by mouth daily.    . Omega-3 Fatty Acids (FISH OIL PO) Take 1 capsule by mouth at bedtime.    Marland Kitchen oxyCODONE-acetaminophen (PERCOCET/ROXICET) 5-325 MG tablet Take 1 tablet by mouth every 4 (four) hours as needed for severe pain. 15 tablet 0  . TIADYLT ER 180 MG 24 hr capsule     . valsartan (DIOVAN) 320 MG tablet Take 320 mg by mouth daily.     No current facility-administered medications on file prior to visit.     Allergies  Allergen Reactions  . Keflex [Cephalexin] Rash    Objective:  General: Alert and oriented x3 in no acute distress  Dermatology: No open lesions bilateral lower extremities, no webspace macerations, no ecchymosis bilateral, all nails x 10 are well manicured.  Vascular: Dorsalis Pedis and Posterior Tibial pedal pulses palpable, Capillary Fill Time 5 seconds,(+), (+)Varcosities bilateral, scant pedal hair growth bilateral, trace edema bilateral lower extremities, Temperature gradient within normal limits.  Neurology: Johney Maine sensation intact via light touch bilateral.  Musculoskeletal: Mild tenderness with palpation at Left ankle at medial>anterior>lateral gutter and tendon course. Negative talar tilt, Negative tib-fib stress, No instability. No  pain with calf compression bilateral. Range of motion within normal limits with mild guarding on Left ankle worse with Plantarflexion. Strength within normal limits in all groups bilateral for patient's age.   Gait: Antalgic gait  Xrays  Left Ankle   Impression: mild ankle arthritis at lateral gutter, decreased osseous mineralization, no other acute findings.   Assessment and Plan: Problem  List Items Addressed This Visit      Other   Pain in left foot - Primary   Relevant Medications   predniSONE (STERAPRED UNI-PAK 21 TAB) 10 MG (21) TBPK tablet    Other Visit Diagnoses    Capsulitis       Relevant Medications   predniSONE (STERAPRED UNI-PAK 21 TAB) 10 MG (21) TBPK tablet   Tendonitis       Relevant Medications   predniSONE (STERAPRED UNI-PAK 21 TAB) 10 MG (21) TBPK tablet      -Complete examination performed -Xrays reviewed -Discussed treatement options for tendonitis secondary to gait -Rx Prednisone pak -Dispensed ankle gaunlet to use on left for stability for 1 month and recommend good supportive shoes -Patient to return to office if pain is not better after 1 month or sooner if condition worsens.  Asencion Islamitorya Joy Haegele, DPM

## 2019-05-21 ENCOUNTER — Ambulatory Visit (INDEPENDENT_AMBULATORY_CARE_PROVIDER_SITE_OTHER): Payer: Medicare Other

## 2019-05-21 ENCOUNTER — Ambulatory Visit (HOSPITAL_COMMUNITY)
Admission: EM | Admit: 2019-05-21 | Discharge: 2019-05-21 | Disposition: A | Discharge status: Home / Self Care | Payer: Medicare Other | Attending: Family Medicine | Admitting: Family Medicine

## 2019-05-21 ENCOUNTER — Other Ambulatory Visit: Payer: Self-pay

## 2019-05-21 ENCOUNTER — Encounter (HOSPITAL_COMMUNITY): Payer: Self-pay | Admitting: Emergency Medicine

## 2019-05-21 DIAGNOSIS — M25511 Pain in right shoulder: Secondary | ICD-10-CM

## 2019-05-21 DIAGNOSIS — I10 Essential (primary) hypertension: Secondary | ICD-10-CM | POA: Diagnosis not present

## 2019-05-21 MED ORDER — TRAMADOL HCL 50 MG PO TABS: 50.0000 mg | ORAL_TABLET | Freq: Three times a day (TID) | ORAL | 0 refills | Status: DC | PRN

## 2019-05-21 MED ORDER — TRAMADOL HCL 50 MG PO TABS: 50.0000 mg | ORAL_TABLET | Freq: Four times a day (QID) | ORAL | 0 refills | Status: DC | PRN

## 2019-05-21 MED ORDER — DICLOFENAC SODIUM 1 % TD GEL: 2.0000 g | Freq: Four times a day (QID) | TRANSDERMAL | 0 refills | Status: DC

## 2019-05-21 NOTE — ED Triage Notes (Signed)
Per pt her right shoulder has been hurting for about 1 week. No falls no injury. Pt says possible arthritis.

## 2019-05-21 NOTE — Discharge Instructions (Addendum)
Your x-ray was normal today.  I am prescribing tramadol 50 mg for you to take every 8 hours as needed for pain and diclofenac topical rub 4 times daily as needed for pain. If pain does not improve, he is to follow-up with your primary care provider for further management and evaluation.

## 2019-05-21 NOTE — ED Provider Notes (Signed)
[facility]    CSN: [id number] Arrival date & time: 05/21/19  1031      History   Chief Complaint Chief Complaint  Patient presents with  . Shoulder Pain    HPI Sheila PuttKathryn B Spears is a 85 y.o. female.   HPI  Right shoulder pain x 2 weeks.  The only possible injury patient recalls is tripping over her dog although she did not fall she remembers given herself a "bear hug" and noticed since that time her left shoulder and left upper bicep region has been tender and aching.  Endorses limited range of motion with extending and flexing of her left arm. She endorses tenderness a the [facility] joint and shoulder and upper arm pain with bending left arm. She has taken Tylenol without significant relief of pain. Recently completed a prednisone taper for ankle pain and reports that did not help with shoulder pain.  Past Medical History:  Diagnosis Date  . Glaucoma   . Hypertension     Patient Active Problem List   Diagnosis Date Noted  . Pain in left foot 02/03/2019  . Trigger middle finger of left hand 01/12/2019  . Closed sternal manubrial dissociation fracture with routine healing 12/21/2017  . Impaired fasting glucose 04/14/2017  . Microscopic hematuria 04/14/2017  . Neutropenia (HCC) 04/14/2017  . Osteopenia 04/14/2017  . Pain in finger of right hand 02/06/2017  . Onychomycosis 06/20/2015  . Pain in lower limb 06/20/2015  . Cellulitis of right upper extremity   . Cellulitis 05/08/2015  . Essential hypertension 05/08/2015  . Macular erythematous rash, nonblanching 05/08/2015    Past Surgical History:  Procedure Laterality Date  . ABDOMINAL HYSTERECTOMY    . TRIGGER FINGER RELEASE Right 01/15/2017   Procedure: RELEASE TRIGGER FINGER/A-1 PULLEY RIGHT RING FINGER;  Surgeon: Betha LoaKevin Kuzma, MD;  Location: [facility];  Service: Orthopedics;  Laterality: Right;    OB History   No obstetric history on file.      Home Medications    Prior to Admission  medications   Medication Sig Start Date End Date Taking? Authorizing Provider  Calcium Citrate-Vitamin D 315-250 MG-UNIT TABS Take 1 tablet by mouth daily.    [provider]  diclofenac sodium (VOLTAREN) 1 % GEL Apply 2 g topically 4 (four) times daily. 06/13/18   Cathie HoopsYu, Amy V, PA-C  diltiazem (DILACOR XR) 180 MG 24 hr capsule Take 180 mg by mouth daily.    [provider]  docusate sodium (COLACE) 100 MG capsule Take 1 capsule (100 mg total) by mouth every 12 (twelve) hours. 12/15/17   Jacalyn LefevreHaviland, Julie, MD  dorzolamide-timolol (COSOPT) 22.3-6.8 MG/ML ophthalmic solution Place 1 drop into the left eye 2 (two) times daily. 10/06/17   [provider]  ferrous sulfate 325 (65 FE) MG tablet Take 325 mg by mouth as needed (ONLY TAKES WHEN NEEDED, AD DIRECTED BY MD).     [provider]  latanoprost (XALATAN) 0.005 % ophthalmic solution Place 1 drop into both eyes at bedtime.     [provider]  Multiple Vitamin (MULTIVITAMIN WITH MINERALS) TABS tablet Take 1 tablet by mouth daily.    [provider]  Multiple Vitamins-Minerals (PRESERVISION AREDS PO) Take by mouth.    [provider]  Multiple Vitamins-Minerals (PRESERVISION AREDS) CAPS Take 1 capsule by mouth daily.    [provider]  Omega-3 Fatty Acids (FISH OIL PO) Take 1 capsule by mouth at bedtime.    [provider]  oxyCODONE-acetaminophen (  PERCOCET/ROXICET) 5-325 MG tablet Take 1 tablet by mouth every 4 (four) hours as needed for severe pain. 12/15/17   Jacalyn LefevreHaviland, Julie, MD  predniSONE (STERAPRED UNI-PAK 21 TAB) 10 MG (21) TBPK tablet Take by mouth daily. Take as directed. 05/10/19   Asencion IslamStover, Titorya, DPM  TIADYLT ER 180 MG 24 hr capsule  05/01/19   [provider]  valsartan (DIOVAN) 320 MG tablet Take 320 mg by mouth daily.    [provider]    Family History Family History  Problem Relation Age of Onset  . Cancer - Colon Mother     Social  History Social History   Tobacco Use  . Smoking status: Never Smoker  . Smokeless tobacco: Never Used  Substance Use Topics  . Alcohol use: No    Alcohol/week: 0.0 standard drinks  . Drug use: No     Allergies   Keflex [cephalexin]   Review of Systems Review of Systems Pertinent negatives listed in HPI Physical Exam Triage Vital Signs ED Triage Vitals [05/21/19 1052]  Enc Vitals Group     BP (!) 147/72     Pulse Rate 94     Resp 16     Temp 97.7 F (36.5 C)     Temp Source Oral     SpO2 98 %     Weight      Height      Head Circumference      Peak Flow      Pain Score 5     Pain Loc      Pain Edu?      Excl. in GC?    No data found.  Updated Vital Signs BP (!) 147/72 (BP Location: Right Arm)   Pulse 94   Temp 97.7 F (36.5 C) (Oral)   Resp 16   SpO2 98%   Visual Acuity Right Eye Distance:   Left Eye Distance:   Bilateral Distance:    Right Eye Near:   Left Eye Near:    Bilateral Near:     Physical Exam Constitutional:      Appearance: Normal appearance.  HENT:     Head: Normocephalic and atraumatic.  Neck:     Musculoskeletal: Normal range of motion.  Cardiovascular:     Rate and Rhythm: Normal rate.     Pulses: Normal pulses.  Pulmonary:     Effort: Pulmonary effort is normal.  Musculoskeletal:     Right shoulder: She exhibits decreased range of motion, tenderness, bony tenderness and decreased strength. She exhibits no spasm and normal pulse.  Neurological:     Mental Status: She is alert.    UC Treatments / Results  Labs (all labs ordered are listed, but only abnormal results are displayed) Labs Reviewed - No data to display  EKG None  Radiology Dg Shoulder Right  Result Date: 05/21/2019 CLINICAL DATA:  RIGHT shoulder pain for 2 weeks. Recent injury. Limited range of motion. EXAM: RIGHT SHOULDER - 2+ VIEW COMPARISON:  None. FINDINGS: Osseous alignment is normal. No fracture line or displaced fracture fragment seen. No  significant degenerative change appreciated. Soft tissues about the RIGHT shoulder are unremarkable. IMPRESSION: Negative. Electronically Signed   By: Bary RichardStan  Maynard M.D.   On: 05/21/2019 11:55    Procedures Procedures (including critical care time)  Medications Ordered in UC Medications - No data to display  Initial Impression / Assessment and Plan / UC Course  I have reviewed the triage vital signs and the nursing notes.  Pertinent labs & imaging results that were available during my care of the patient were reviewed by me and considered in my medical decision making (see chart for details).   Patient presents today for evaluation right shoulder pain.  Imaging negative for fracture or any degenerative changes.  Will prescribe a short course of tramadol and topical diclofenac for anti-inflammatory action.  Patient encouraged to apply heat as needed to help with pain.  If symptoms worsen or do not improve she is advised to follow-up with primary care provider.   Final Clinical Impressions(s) / UC Diagnoses   Final diagnoses:  Acute pain of right shoulder     Discharge Instructions     Your x-ray was normal today.  I am prescribing tramadol 50 mg for you to take every 8 hours as needed for pain and diclofenac topical rub 4 times daily as needed for pain. If pain does not improve, he is to follow-up with your primary care provider for further management and evaluation.    ED Prescriptions    Medication Sig Dispense Auth. Provider   traMADol (ULTRAM) 50 MG tablet  (Status: Discontinued) Take 1 tablet (50 mg total) by mouth every 6 (six) hours as needed. 15 tablet Scot Jun, FNP   traMADol (ULTRAM) 50 MG tablet Take 1 tablet (50 mg total) by mouth every 8 (eight) hours as needed. 15 tablet Scot Jun, FNP   diclofenac sodium (VOLTAREN) 1 % GEL Apply 2 g topically 4 (four) times daily. 100 g Scot Jun, FNP     Controlled Substance Prescriptions [place] Controlled  Substance Registry consulted? Not Applicable   Scot Jun, FNP 05/22/19 [phone]

## 2019-05-22 ENCOUNTER — Telehealth (HOSPITAL_COMMUNITY): Payer: Self-pay | Admitting: *Deleted

## 2019-05-22 NOTE — Telephone Encounter (Signed)
Per family member: pharmacy informed pt she needs insurance approval for diclofenac gel.  Discussed with Rondel Oh, NP; Per NP: diclofenac gel can now be purchased OTC.  Recommended pt tries that; however, if she is unsuccessful in finding it, she may just use the tramadol.  Family member verbalized understanding.

## 2019-06-14 ENCOUNTER — Ambulatory Visit: Payer: Medicare Other | Admitting: Sports Medicine

## 2019-06-14 ENCOUNTER — Other Ambulatory Visit: Payer: Self-pay

## 2019-06-14 ENCOUNTER — Encounter: Payer: Self-pay | Admitting: Sports Medicine

## 2019-06-14 VITALS — Temp 97.7°F

## 2019-06-14 DIAGNOSIS — M25572 Pain in left ankle and joints of left foot: Secondary | ICD-10-CM

## 2019-06-14 DIAGNOSIS — M779 Enthesopathy, unspecified: Secondary | ICD-10-CM

## 2019-06-14 DIAGNOSIS — I10 Essential (primary) hypertension: Secondary | ICD-10-CM | POA: Diagnosis not present

## 2019-06-14 DIAGNOSIS — M8580 Other specified disorders of bone density and structure, unspecified site: Secondary | ICD-10-CM | POA: Diagnosis not present

## 2019-06-14 DIAGNOSIS — D709 Neutropenia, unspecified: Secondary | ICD-10-CM | POA: Diagnosis not present

## 2019-06-14 DIAGNOSIS — R7301 Impaired fasting glucose: Secondary | ICD-10-CM | POA: Diagnosis not present

## 2019-06-14 MED ORDER — TRIAMCINOLONE ACETONIDE 10 MG/ML IJ SUSP
10.0000 mg | Freq: Once | INTRAMUSCULAR | Status: AC
  Administered 2019-06-14: 10 mg

## 2019-06-14 NOTE — Progress Notes (Signed)
Subjective:  Elmo PuttKathryn B Maxton is a 85 y.o. female patient who returns office for follow-up evaluation of left ankle pain that radiates to calf. Patient complains of continued pain in the ankle reports that while she was on the steroid by mouth there was some reduction in her pain but after she finished the medication the pain came back.  Patient reports that the pain is 8 out of 10 worse with standing walking or excessive activity.  Patient reports that she has been trying to wear her brace but is unsure if she is using it correctly.  No other issues.   Patient Active Problem List   Diagnosis Date Noted  . Pain in left foot 02/03/2019  . Trigger middle finger of left hand 01/12/2019  . Closed sternal manubrial dissociation fracture with routine healing 12/21/2017  . Impaired fasting glucose 04/14/2017  . Microscopic hematuria 04/14/2017  . Neutropenia (HCC) 04/14/2017  . Osteopenia 04/14/2017  . Pain in finger of right hand 02/06/2017  . Onychomycosis 06/20/2015  . Pain in lower limb 06/20/2015  . Cellulitis of right upper extremity   . Cellulitis 05/08/2015  . Essential hypertension 05/08/2015  . Macular erythematous rash, nonblanching 05/08/2015    Current Outpatient Medications on File Prior to Visit  Medication Sig Dispense Refill  . Calcium Citrate-Vitamin D 315-250 MG-UNIT TABS Take 1 tablet by mouth daily.    . diclofenac sodium (VOLTAREN) 1 % GEL Apply 2 g topically 4 (four) times daily. 100 g 0  . diltiazem (DILACOR XR) 180 MG 24 hr capsule Take 180 mg by mouth daily.    Marland Kitchen. docusate sodium (COLACE) 100 MG capsule Take 1 capsule (100 mg total) by mouth every 12 (twelve) hours. 60 capsule 0  . dorzolamide-timolol (COSOPT) 22.3-6.8 MG/ML ophthalmic solution Place 1 drop into the left eye 2 (two) times daily.  11  . ferrous sulfate 325 (65 FE) MG tablet Take 325 mg by mouth as needed (ONLY TAKES WHEN NEEDED, AD DIRECTED BY MD).     Marland Kitchen. latanoprost (XALATAN) 0.005 % ophthalmic solution  Place 1 drop into both eyes at bedtime.     . Multiple Vitamin (MULTIVITAMIN WITH MINERALS) TABS tablet Take 1 tablet by mouth daily.    . Multiple Vitamins-Minerals (PRESERVISION AREDS PO) Take by mouth.    . Multiple Vitamins-Minerals (PRESERVISION AREDS) CAPS Take 1 capsule by mouth daily.    . Omega-3 Fatty Acids (FISH OIL PO) Take 1 capsule by mouth at bedtime.    Marland Kitchen. oxyCODONE-acetaminophen (PERCOCET/ROXICET) 5-325 MG tablet Take 1 tablet by mouth every 4 (four) hours as needed for severe pain. 15 tablet 0  . predniSONE (STERAPRED UNI-PAK 21 TAB) 10 MG (21) TBPK tablet Take by mouth daily. Take as directed. 21 tablet 0  . TIADYLT ER 180 MG 24 hr capsule     . traMADol (ULTRAM) 50 MG tablet Take 1 tablet (50 mg total) by mouth every 8 (eight) hours as needed. 15 tablet 0  . valsartan (DIOVAN) 320 MG tablet Take 320 mg by mouth daily.     No current facility-administered medications on file prior to visit.     Allergies  Allergen Reactions  . Keflex [Cephalexin] Rash    Objective:  General: Alert and oriented x3 in no acute distress  Dermatology: No open lesions bilateral lower extremities, no webspace macerations, no ecchymosis bilateral, all nails x 10 are well manicured.  Vascular: Dorsalis Pedis and Posterior Tibial pedal pulses palpable, Capillary Fill Time 5 seconds,(+), (+)Varcosities bilateral, scant  pedal hair growth bilateral, trace edema bilateral lower extremities, Temperature gradient within normal limits.  Neurology: Johney Maine sensation intact via light touch bilateral.  Musculoskeletal: Mild tenderness with palpation at Left ankle at medial and lateral gutter greater than anterior over the extensor and the peroneal tendon course today the pain is worse at the peroneal tendon and medial ankle area.. Negative talar tilt, Negative tib-fib stress, No instability. No pain with calf compression bilateral. Range of motion within normal limits with mild guarding on Left ankle worse  with Plantarflexion like before. Strength within normal limits in all groups bilateral for patient's age.   Assessment and Plan: Problem List Items Addressed This Visit    None    Visit Diagnoses    Acute left ankle pain    -  Primary   Relevant Medications   triamcinolone acetonide (KENALOG) 10 MG/ML injection 10 mg (Completed) (Start on 06/14/2019  9:00 PM)   Capsulitis       Relevant Medications   triamcinolone acetonide (KENALOG) 10 MG/ML injection 10 mg (Completed) (Start on 06/14/2019  9:00 PM)   Tendonitis          -Complete examination performed -Previous xrays reviewed -Re-Discussed treatement options for tendonitis and capsulitis secondary to gait -After oral consent and aseptic prep, injected a mixture containing 1 ml of 2%  plain lidocaine, 1 ml 0.5% plain marcaine, 0.5 ml of kenalog 10 and 0.5 ml of dexamethasone phosphate into left ankle divided evenly amongst the peroneal tendon and medial ankle gutter without complication. Post-injection care discussed with patient.  -Reeducated patient on the proper use of her ankle gaunlet to use on left for stability for 1 month and recommend good supportive shoes like before -Patient to return to office if pain is not better after 1 month or sooner if condition worsens.  Advised patient if pain continues may require an advanced study like a CT scan or a MRI.  Landis Martins, DPM

## 2019-06-25 ENCOUNTER — Other Ambulatory Visit: Payer: Self-pay

## 2019-06-25 DIAGNOSIS — Z20822 Contact with and (suspected) exposure to covid-19: Secondary | ICD-10-CM

## 2019-06-27 LAB — NOVEL CORONAVIRUS, NAA: SARS-CoV-2, NAA: NOT DETECTED

## 2019-07-07 DIAGNOSIS — M79605 Pain in left leg: Secondary | ICD-10-CM | POA: Diagnosis not present

## 2019-07-07 DIAGNOSIS — M545 Low back pain, unspecified: Secondary | ICD-10-CM | POA: Insufficient documentation

## 2019-08-22 ENCOUNTER — Telehealth: Payer: Self-pay | Admitting: *Deleted

## 2019-08-22 NOTE — Telephone Encounter (Signed)
I called pt and asked what was going with her feet and she states she is having severe pain in her left foot and ankle. I offered pt an appt tomorrow 8:15am.

## 2019-08-22 NOTE — Telephone Encounter (Signed)
Pt called states she is in some much pain and Dr. Cannon Kettle had given her prednisone before.

## 2019-08-23 ENCOUNTER — Ambulatory Visit: Payer: Medicare Other | Admitting: Sports Medicine

## 2019-08-23 ENCOUNTER — Other Ambulatory Visit: Payer: Self-pay

## 2019-08-23 ENCOUNTER — Encounter: Payer: Self-pay | Admitting: Sports Medicine

## 2019-08-23 DIAGNOSIS — M7752 Other enthesopathy of left foot: Secondary | ICD-10-CM | POA: Diagnosis not present

## 2019-08-23 DIAGNOSIS — M25572 Pain in left ankle and joints of left foot: Secondary | ICD-10-CM

## 2019-08-23 DIAGNOSIS — M779 Enthesopathy, unspecified: Secondary | ICD-10-CM

## 2019-08-23 MED ORDER — TRIAMCINOLONE ACETONIDE 10 MG/ML IJ SUSP
10.0000 mg | Freq: Once | INTRAMUSCULAR | Status: AC
  Administered 2019-08-23: 20:00:00 10 mg

## 2019-08-23 NOTE — Progress Notes (Addendum)
Subjective: Sheila Spears is a 85 y.o. female patient who returns office for follow-up evaluation of left ankle pain. Reports the previous injection helped tremendously. Reports pain 8/10 to lateral ankle and to top of foot with tingling. Denies any new issues or injury.  No other issues.   Patient Active Problem List   Diagnosis Date Noted  . Pain in left foot 02/03/2019  . Trigger middle finger of left hand 01/12/2019  . Closed sternal manubrial dissociation fracture with routine healing 12/21/2017  . Impaired fasting glucose 04/14/2017  . Microscopic hematuria 04/14/2017  . Neutropenia (HCC) 04/14/2017  . Osteopenia 04/14/2017  . Pain in finger of right hand 02/06/2017  . Onychomycosis 06/20/2015  . Pain in lower limb 06/20/2015  . Cellulitis of right upper extremity   . Cellulitis 05/08/2015  . Essential hypertension 05/08/2015  . Macular erythematous rash, nonblanching 05/08/2015    Current Outpatient Medications on File Prior to Visit  Medication Sig Dispense Refill  . Calcium Citrate-Vitamin D 315-250 MG-UNIT TABS Take 1 tablet by mouth daily.    . diclofenac sodium (VOLTAREN) 1 % GEL Apply 2 g topically 4 (four) times daily. 100 g 0  . diltiazem (DILACOR XR) 180 MG 24 hr capsule Take 180 mg by mouth daily.    Marland Kitchen docusate sodium (COLACE) 100 MG capsule Take 1 capsule (100 mg total) by mouth every 12 (twelve) hours. 60 capsule 0  . dorzolamide-timolol (COSOPT) 22.3-6.8 MG/ML ophthalmic solution Place 1 drop into the left eye 2 (two) times daily.  11  . ferrous sulfate 325 (65 FE) MG tablet Take 325 mg by mouth as needed (ONLY TAKES WHEN NEEDED, AD DIRECTED BY MD).     Marland Kitchen latanoprost (XALATAN) 0.005 % ophthalmic solution Place 1 drop into both eyes at bedtime.     . Multiple Vitamin (MULTIVITAMIN WITH MINERALS) TABS tablet Take 1 tablet by mouth daily.    . Multiple Vitamins-Minerals (PRESERVISION AREDS PO) Take by mouth.    . Multiple Vitamins-Minerals (PRESERVISION AREDS) CAPS  Take 1 capsule by mouth daily.    . Omega-3 Fatty Acids (FISH OIL PO) Take 1 capsule by mouth at bedtime.    Marland Kitchen oxyCODONE-acetaminophen (PERCOCET/ROXICET) 5-325 MG tablet Take 1 tablet by mouth every 4 (four) hours as needed for severe pain. 15 tablet 0  . predniSONE (STERAPRED UNI-PAK 21 TAB) 10 MG (21) TBPK tablet Take by mouth daily. Take as directed. 21 tablet 0  . TIADYLT ER 180 MG 24 hr capsule     . traMADol (ULTRAM) 50 MG tablet Take 1 tablet (50 mg total) by mouth every 8 (eight) hours as needed. 15 tablet 0  . valsartan (DIOVAN) 320 MG tablet Take 320 mg by mouth daily.     No current facility-administered medications on file prior to visit.     Allergies  Allergen Reactions  . Keflex [Cephalexin] Rash    Objective:  General: Alert and oriented x3 in no acute distress  Dermatology: No open lesions bilateral lower extremities, no webspace macerations, no ecchymosis bilateral, all nails x 10 are well manicured.  Vascular: Dorsalis Pedis and Posterior Tibial pedal pulses palpable, Capillary Fill Time 5 seconds,(+), (+)Varcosities bilateral, scant pedal hair growth bilateral, trace edema bilateral lower extremities, Temperature gradient within normal limits.  Neurology: Michaell Cowing sensation intact via light touch bilateral.  Musculoskeletal: Mild tenderness with palpation at Left ankle at medial and lateral gutter greater than anterior over the extensor and the peroneal tendon course today the pain is worse at  the peroneal tendon and lateral ankle area. Negative talar tilt, Negative tib-fib stress, No instability. No pain with calf compression bilateral. Range of motion within normal limits with mild guarding on Left ankle worse with Plantarflexion. Strength within normal limits in all groups bilateral for patient's age.   Assessment and Plan: Problem List Items Addressed This Visit    None    Visit Diagnoses    Capsulitis    -  Primary   Relevant Medications   triamcinolone  acetonide (KENALOG) 10 MG/ML injection 10 mg (Completed) (Start on 08/23/2019  7:45 PM)   Tendonitis       Relevant Medications   triamcinolone acetonide (KENALOG) 10 MG/ML injection 10 mg (Completed) (Start on 08/23/2019  7:45 PM)   Acute left ankle pain          -Complete examination performed -Previous xrays reviewed -Re-Discussed treatement options for tendonitis and capsulitis and continued pain -After oral consent and aseptic prep, injected a mixture containing 1 ml of 2% plain lidocaine, 1 ml 0.5% plain marcaine, 0.5 ml of kenalog 10 and 0.5 ml of dexamethasone phosphate into left ankle at the peroneal tendon  Post-injection care discussed with patient.  -Re-educated patient on the proper use of her ankle gaunlet to use on left for stability -Rx CT scan or MRI to evaluate Left ankle since patient has not made improvement with conservative care and xrays from 04/2019 did not reveal any significant findings.  -Patient to return to office after [place], DPM

## 2019-08-24 ENCOUNTER — Telehealth: Payer: Self-pay | Admitting: *Deleted

## 2019-08-24 DIAGNOSIS — M779 Enthesopathy, unspecified: Secondary | ICD-10-CM

## 2019-08-24 NOTE — Telephone Encounter (Signed)
Called and spoke with Loma Sousa at [facility] and stated that the patient did not meet the criteria and there was not enough information and sent Dr Cannon Kettle a note to do a peer to peer at [phone]. Lattie Haw

## 2019-08-24 NOTE — Telephone Encounter (Signed)
Orders to L. Cox, CMA for pre-cert, faxed to [facility]. 

## 2019-08-24 NOTE — Telephone Encounter (Signed)
-----   Message from [place], [place] sent at 08/23/2019  7:37 PM EDT ----- Regarding: CT Scan left ankle Pain at ankle an peroneal tendons  >3 months

## 2019-08-25 NOTE — Telephone Encounter (Signed)
FYI I called to do a peer to peer and left message on the Appeal answering machine to call me back re: Peer to Peer for CT scan. Awaiting their callback -Dr. Cannon Kettle

## 2019-08-30 ENCOUNTER — Telehealth: Payer: Self-pay | Admitting: *Deleted

## 2019-08-30 ENCOUNTER — Ambulatory Visit: Payer: Medicare Other | Admitting: Sports Medicine

## 2019-08-30 NOTE — Telephone Encounter (Signed)
Called and spoke with Rema Fendt from [facility] and representative stated because it was a closed [place] case it would have to be re-opened and do an appeal and I left a message for the appeal department to call us back at [phone] so Dr Cannon Kettle can talk to the appeals department. Lattie Haw

## 2019-09-02 ENCOUNTER — Other Ambulatory Visit: Payer: Self-pay | Admitting: *Deleted

## 2019-09-02 ENCOUNTER — Telehealth: Payer: Self-pay | Admitting: *Deleted

## 2019-09-02 DIAGNOSIS — M25572 Pain in left ankle and joints of left foot: Secondary | ICD-10-CM

## 2019-09-02 DIAGNOSIS — M779 Enthesopathy, unspecified: Secondary | ICD-10-CM

## 2019-09-02 NOTE — Telephone Encounter (Signed)
Terrin from [place] called and stated that the patients insurances had to be authorized in order for the scan to be done and the I spoke with Paulo Fruit at [facility] and the the authorization for the CT is [id number] and is valid 09/02/2019 to 02/28/2020. Lattie Haw

## 2019-09-02 NOTE — Addendum Note (Signed)
Addended by: [place] Mon R on: 09/02/2019 04:15 PM   Modules accepted: Orders

## 2019-09-05 ENCOUNTER — Telehealth: Payer: Self-pay | Admitting: *Deleted

## 2019-09-05 NOTE — Telephone Encounter (Signed)
Sheila Spears called from [employer] from the secondary insurance and the authorization number is [id number]. Lattie Haw

## 2019-09-05 NOTE — Telephone Encounter (Signed)
[place] states pt is scheduled for MRI tomorrow, and the [employer] is authorized and the [employer] needs authorization.

## 2019-09-06 ENCOUNTER — Other Ambulatory Visit: Payer: Medicare Other

## 2019-09-08 ENCOUNTER — Telehealth: Payer: Self-pay | Admitting: *Deleted

## 2019-09-08 NOTE — Telephone Encounter (Signed)
Tried to call the patient and I and the patient got disconnected and I tried to call the patient back and went to voice mail and I left a message stating that the patient's insurance did not approve the MRI and to call the office back at [phone] if any concerns or questions. Sheila Spears

## 2019-09-08 NOTE — Telephone Encounter (Signed)
Jasmine from [place] called me back and I stated that the primary [place] did not approve the MRI but the secondary [employer] Medicare HMO did approve the MRI and the authorization number is [id number] and is valid 09/02/2019-02/28/2020 and per [facility] is going to use the secondary insurance for the MRI that will be done on Saturday September 10, 2019 and I did call the patient back to let her know what we had decided to do and if any concerns or questions to call the [place] office at [phone]. Lattie Haw

## 2019-09-08 NOTE — Telephone Encounter (Signed)
[place] states pt is scheduled for MRI 09/10/2019 and her primary insurance has been approved, but the 2nd has not, please advise.

## 2019-09-10 ENCOUNTER — Other Ambulatory Visit: Payer: Self-pay

## 2019-09-10 ENCOUNTER — Ambulatory Visit
Admission: RE | Admit: 2019-09-10 | Discharge: 2019-09-10 | Disposition: A | Discharge status: Home / Self Care | Payer: Medicare Other | Source: Ambulatory Visit | Attending: Sports Medicine | Admitting: Sports Medicine

## 2019-09-10 DIAGNOSIS — M7672 Peroneal tendinitis, left leg: Secondary | ICD-10-CM | POA: Diagnosis not present

## 2019-09-10 DIAGNOSIS — M779 Enthesopathy, unspecified: Secondary | ICD-10-CM

## 2019-09-10 DIAGNOSIS — M25572 Pain in left ankle and joints of left foot: Secondary | ICD-10-CM

## 2019-09-10 DIAGNOSIS — M19072 Primary osteoarthritis, left ankle and foot: Secondary | ICD-10-CM | POA: Diagnosis not present

## 2019-09-14 ENCOUNTER — Telehealth: Payer: Self-pay

## 2019-09-14 NOTE — Telephone Encounter (Signed)
Spoke with Pt about MRI results and the Dr's orders. Pt stated understanding if no relief to follow up in 2 wks

## 2019-09-14 NOTE — Telephone Encounter (Signed)
-----   Message from [place], [place] sent at 09/11/2019 10:05 PM EDT ----- Will  You let patient know that her MRI showed arthritis and some inflammation along the tendon that has been hurting her on the side of her ankle. There was NO TEAR and NO FRACTURE. If she still has pain she should make a follow up to see me in [place] in 2 weeks and should continue with rest, ice, elevation, tylenol, and topical pain cream as needed.

## 2019-10-07 DIAGNOSIS — D709 Neutropenia, unspecified: Secondary | ICD-10-CM | POA: Diagnosis not present

## 2019-10-07 DIAGNOSIS — M8580 Other specified disorders of bone density and structure, unspecified site: Secondary | ICD-10-CM | POA: Diagnosis not present

## 2019-10-07 DIAGNOSIS — R7301 Impaired fasting glucose: Secondary | ICD-10-CM | POA: Diagnosis not present

## 2019-10-14 DIAGNOSIS — Z23 Encounter for immunization: Secondary | ICD-10-CM | POA: Diagnosis not present

## 2019-10-14 DIAGNOSIS — I10 Essential (primary) hypertension: Secondary | ICD-10-CM | POA: Diagnosis not present

## 2019-10-14 DIAGNOSIS — I38 Endocarditis, valve unspecified: Secondary | ICD-10-CM | POA: Diagnosis not present

## 2019-10-14 DIAGNOSIS — R7301 Impaired fasting glucose: Secondary | ICD-10-CM | POA: Diagnosis not present

## 2019-11-14 DIAGNOSIS — H401123 Primary open-angle glaucoma, left eye, severe stage: Secondary | ICD-10-CM | POA: Diagnosis not present

## 2019-11-14 DIAGNOSIS — H401112 Primary open-angle glaucoma, right eye, moderate stage: Secondary | ICD-10-CM | POA: Diagnosis not present

## 2019-11-29 ENCOUNTER — Other Ambulatory Visit: Payer: Self-pay

## 2019-11-29 ENCOUNTER — Ambulatory Visit (INDEPENDENT_AMBULATORY_CARE_PROVIDER_SITE_OTHER): Payer: Medicare Other | Admitting: Podiatry

## 2019-11-29 ENCOUNTER — Encounter: Payer: Self-pay | Admitting: Podiatry

## 2019-11-29 DIAGNOSIS — M79674 Pain in right toe(s): Secondary | ICD-10-CM

## 2019-11-29 DIAGNOSIS — M79675 Pain in left toe(s): Secondary | ICD-10-CM

## 2019-11-29 DIAGNOSIS — B351 Tinea unguium: Secondary | ICD-10-CM | POA: Diagnosis not present

## 2019-11-29 NOTE — Patient Instructions (Signed)

## 2019-12-04 NOTE — Progress Notes (Signed)
Subjective:  Sheila Spears presents to clinic today with cc of  painful, thick, discolored, elongated toenails  of both feet that become tender and patient cannot cut because of thickness. Pain is aggravated when wearing enclosed shoe gear and relieved with periodic professional debridement.  Patient voices no new pedal concerns on today's visit.  Medications reviewed in chart.  Allergies  Allergen Reactions  . Keflex [Cephalexin] Rash    Objective:  Physical Examination:  Vascular Examination: Capillary refill time to digits <5 seconds b/l.  Palpable DP/PT pulses b/l.  Digital hair diminished b/l.   Trace edema noted b/l.  Skin temperature gradient WNL b/l.  Dermatological Examination: Skin with normal turgor, texture and tone b/l.  No open wounds b/l.  No interdigital macerations noted b/l.  Elongated, thick, discolored brittle toenails with subungual debris and pain on dorsal palpation of nailbeds 1-5 b/l.  Musculoskeletal Examination: Muscle strength 5/5 to all muscle groups b/l.  No pain, crepitus or joint discomfort with active/passive ROM.  Neurological Examination: Sensation intact 5/5 b/l with 10 gram monofilament.  Vibratory sensation intact b/l.  Proprioceptive sensation intact b/l.  Assessment: Mycotic nail infection with pain 1-5 b/l  Plan: 1. Toenails 1-5 b/l were debrided in length and girth without iatrogenic laceration. 2.  Continue soft, supportive shoe gear daily. 3.  Report any pedal injuries to medical professional. 4.  Follow up 3 months. 5.  Patient/POA to call should there be a question/concern in there interim.

## 2019-12-29 DIAGNOSIS — Z1231 Encounter for screening mammogram for malignant neoplasm of breast: Secondary | ICD-10-CM | POA: Diagnosis not present

## 2020-02-28 ENCOUNTER — Encounter: Payer: Self-pay | Admitting: Podiatry

## 2020-02-28 ENCOUNTER — Other Ambulatory Visit: Payer: Self-pay

## 2020-02-28 ENCOUNTER — Ambulatory Visit (INDEPENDENT_AMBULATORY_CARE_PROVIDER_SITE_OTHER): Payer: Medicare Other | Admitting: Podiatry

## 2020-02-28 VITALS — Temp 96.6°F

## 2020-02-28 DIAGNOSIS — B351 Tinea unguium: Secondary | ICD-10-CM

## 2020-02-28 DIAGNOSIS — M79674 Pain in right toe(s): Secondary | ICD-10-CM | POA: Diagnosis not present

## 2020-02-28 DIAGNOSIS — M79675 Pain in left toe(s): Secondary | ICD-10-CM | POA: Diagnosis not present

## 2020-02-28 NOTE — Patient Instructions (Signed)

## 2020-03-05 NOTE — Progress Notes (Signed)
Subjective: Sheila Spears presents today for follow up of painful mycotic nails b/l that are difficult to trim. Pain interferes with ambulation. Aggravating factors include wearing enclosed shoe gear. Pain is relieved with periodic professional debridement.   She voices no new pedal concerns on today's visit.  Allergies  Allergen Reactions  . Keflex [Cephalexin] Rash     Objective: Vitals:   02/28/20 1455  Temp: (!) 96.6 F (35.9 C)    Pt 86 y.o. year old AA female WD, WN in NAD. AAO x 3.   Vascular Examination:  Capillary refill time to digits immediate b/l. Capillary fill time to digits delayed. Palpable DP pulses b/l. Palpable PT pulses b/l. Pedal hair absent b/l Skin temperature gradient within normal limits b/l.  Dermatological Examination: Pedal skin with normal turgor, texture and tone bilaterally. No open wounds bilaterally. Toenails 1-5 b/l elongated, dystrophic, thickened, crumbly with subungual debris and tenderness to dorsal palpation.  Musculoskeletal: Normal muscle strength 5/5 to all lower extremity muscle groups bilaterally, no gross bony deformities bilaterally and no pain crepitus or joint limitation noted with ROM b/l  Neurological: Protective sensation intact 5/5 intact bilaterally with 10g monofilament b/l Vibratory sensation intact b/l  Assessment: 1. Pain due to onychomycosis of toenails of both feet    Plan: -Toenails 1-5 b/l were debrided in length and girth with sterile nail nippers and dremel without iatrogenic bleeding.  -Patient to continue soft, supportive shoe gear daily. -Patient to report any pedal injuries to medical professional immediately. -Patient/POA to call should there be question/concern in the interim.  Return in about 3 months (around 05/29/2020) for nail trim.

## 2020-05-16 DIAGNOSIS — H02402 Unspecified ptosis of left eyelid: Secondary | ICD-10-CM | POA: Diagnosis not present

## 2020-05-16 DIAGNOSIS — H401123 Primary open-angle glaucoma, left eye, severe stage: Secondary | ICD-10-CM | POA: Diagnosis not present

## 2020-05-16 DIAGNOSIS — H401112 Primary open-angle glaucoma, right eye, moderate stage: Secondary | ICD-10-CM | POA: Diagnosis not present

## 2020-05-16 DIAGNOSIS — H0102A Squamous blepharitis right eye, upper and lower eyelids: Secondary | ICD-10-CM | POA: Diagnosis not present

## 2020-05-22 DIAGNOSIS — I10 Essential (primary) hypertension: Secondary | ICD-10-CM | POA: Diagnosis not present

## 2020-05-22 DIAGNOSIS — H02409 Unspecified ptosis of unspecified eyelid: Secondary | ICD-10-CM | POA: Diagnosis not present

## 2020-05-22 DIAGNOSIS — Z1389 Encounter for screening for other disorder: Secondary | ICD-10-CM | POA: Diagnosis not present

## 2020-05-22 DIAGNOSIS — H409 Unspecified glaucoma: Secondary | ICD-10-CM | POA: Diagnosis not present

## 2020-05-29 ENCOUNTER — Encounter: Payer: Self-pay | Admitting: Podiatry

## 2020-05-29 ENCOUNTER — Ambulatory Visit (INDEPENDENT_AMBULATORY_CARE_PROVIDER_SITE_OTHER): Payer: Medicare Other | Admitting: Podiatry

## 2020-05-29 ENCOUNTER — Other Ambulatory Visit: Payer: Self-pay

## 2020-05-29 VITALS — Temp 97.1°F

## 2020-05-29 DIAGNOSIS — M79674 Pain in right toe(s): Secondary | ICD-10-CM

## 2020-05-29 DIAGNOSIS — B351 Tinea unguium: Secondary | ICD-10-CM

## 2020-05-29 DIAGNOSIS — M79675 Pain in left toe(s): Secondary | ICD-10-CM | POA: Diagnosis not present

## 2020-06-01 NOTE — Progress Notes (Signed)
Subjective:  Patient ID: Sheila Spears, female    DOB: 20-Mar-1922,  MRN: [id number]  Sheila Spears presents to clinic today for painful thick toenails that are difficult to trim. Pain interferes with ambulation. Aggravating factors include wearing enclosed shoe gear. Pain is relieved with periodic professional debridement..  86 y.o. female presents with the above complaint.  Reports painfully elongated nails to both feet.  Review of Systems: Negative except as noted in the HPI. Past Medical History:  Diagnosis Date  . Glaucoma   . Hypertension    Past Surgical History:  Procedure Laterality Date  . ABDOMINAL HYSTERECTOMY    . TRIGGER FINGER RELEASE Right 01/15/2017   Procedure: RELEASE TRIGGER FINGER/A-1 PULLEY RIGHT RING FINGER;  Surgeon: Betha Loa, MD;  Location: [facility];  Service: Orthopedics;  Laterality: Right;    Current Outpatient Medications:  .  acetaminophen (TYLENOL) 500 MG tablet, Take 1 tablet twice a day for foot and ankle pain, Disp: , Rfl:  .  Calcium Citrate-Vitamin D 315-250 MG-UNIT TABS, Take 1 tablet by mouth daily., Disp: , Rfl:  .  diclofenac sodium (VOLTAREN) 1 % GEL, Apply 2 g topically 4 (four) times daily., Disp: 100 g, Rfl: 0 .  diltiazem (DILACOR XR) 180 MG 24 hr capsule, Take 180 mg by mouth daily., Disp: , Rfl:  .  docusate sodium (COLACE) 100 MG capsule, Take 1 capsule (100 mg total) by mouth every 12 (twelve) hours., Disp: 60 capsule, Rfl: 0 .  dorzolamide-timolol (COSOPT) 22.3-6.8 MG/ML ophthalmic solution, Place 1 drop into the left eye 2 (two) times daily., Disp: , Rfl: 11 .  erythromycin ophthalmic ointment, SMARTSIG:0.25 Inch(es) In Eye(s), Disp: , Rfl:  .  latanoprost (XALATAN) 0.005 % ophthalmic solution, Place 1 drop into both eyes at bedtime. , Disp: , Rfl:  .  Multiple Vitamin (MULTIVITAMIN WITH MINERALS) TABS tablet, Take 1 tablet by mouth daily., Disp: , Rfl:  .  Multiple Vitamins-Minerals (PRESERVISION AREDS PO), Take  by mouth., Disp: , Rfl:  .  Omega-3 Fatty Acids (FISH OIL PO), Take 1 capsule by mouth at bedtime., Disp: , Rfl:  .  TIADYLT ER 180 MG 24 hr capsule, , Disp: , Rfl:  .  valsartan (DIOVAN) 320 MG tablet, Take 320 mg by mouth daily., Disp: , Rfl:  .  ferrous sulfate 325 (65 FE) MG tablet, Take 325 mg by mouth as needed (ONLY TAKES WHEN NEEDED, AD DIRECTED BY MD).  (Patient not taking: Reported on 05/29/2020), Disp: , Rfl:  .  Multiple Vitamins-Minerals (PRESERVISION AREDS) CAPS, Take 1 capsule by mouth daily. (Patient not taking: Reported on 05/29/2020), Disp: , Rfl:  .  traMADol (ULTRAM) 50 MG tablet, Take 1 tablet (50 mg total) by mouth every 8 (eight) hours as needed., Disp: 15 tablet, Rfl: 0 Allergies  Allergen Reactions  . Keflex [Cephalexin] Rash   @[facility] @ Objective:   Constitutional Sheila Spears is a pleasant 86 y.o. African American female, WD, WN in NAD.94  Vascular Dorsalis pedis pulses palpable bilaterally. Posterior tibial pulses palpable bilaterally. Capillary refill normal to all digits.  No cyanosis or clubbing noted. Pedal hair growth normal.  Neurologic Normal speech. Oriented to person, place, and time. Epicritic sensation to light touch grossly present bilaterally.  Dermatologic Nails elongated dystrophic pain to palpation No open wounds. No skin lesions.  Orthopedic: Normal joint ROM without pain or crepitus bilaterally. No visible deformities. No bony tenderness.   Radiographs: None Assessment:   1. Pain due to onychomycosis of  toenails of both feet    Plan:  Patient was evaluated and treated and all questions answered.  Onychomycosis with pain -Nails palliatively debridement as below -Educated on self-care  Procedure: Nail Debridement Rationale: Pain Type of Debridement: manual, sharp debridement. Instrumentation: Nail nipper, rotary burr. Number of Nails: 10 -Examined patient. -No new findings. No new orders. -Toenails 1-5 b/l were debrided in  length and girth with sterile nail nippers and dremel without iatrogenic bleeding.  -Patient to report any pedal injuries to medical professional immediately. -Patient to continue soft, supportive shoe gear daily. -Patient/POA to call should there be question/concern in the interim. Return in about 3 months (around 08/29/2020) for nail trim. Freddie Breech, DPM

## 2020-06-12 ENCOUNTER — Other Ambulatory Visit: Payer: Self-pay

## 2020-06-12 ENCOUNTER — Encounter: Payer: Self-pay | Admitting: Neurology

## 2020-06-12 ENCOUNTER — Ambulatory Visit: Payer: Medicare Other | Admitting: Neurology

## 2020-06-12 VITALS — BP 195/81 | HR 56 | Ht 63.0 in | Wt 130.0 lb

## 2020-06-12 DIAGNOSIS — H02402 Unspecified ptosis of left eyelid: Secondary | ICD-10-CM | POA: Insufficient documentation

## 2020-06-12 NOTE — Progress Notes (Signed)
HISTORICAL  Sheila Spears is a 86 year old female, seen in request by her ophthalmologist Dr. Alden Hipp for evaluation of left side droopy eyelid, her primary  care physician is Dr. Donette Larry, [facility] Scott, she drove herself to clinic, alone at today's visit on June 11, 2020  I reviewed and summarized the referring note.PMH HTN  She lives with her daughter, she is independent in her daily activity, actually taking care of her daughter who has suffered kidney failure  She woke up 1 morning in April 2021, noticed painful left upper eyelid swelling, turning red, crusting of her left eye, thought she had a stye in her left upper eyelid, her symptoms did improve with hot compression, ointment, but still has persistent left ptosis, she denies visual change, she can read her newspaper without much difficulty,  She denies bulbar weakness, no dysarthria, no dysphagia, no gait abnormality.  I reviewed ophthalmology evaluation from [facility] on Apr 04, 2020, described left upper eyelid swelling, also crusting of left eye, redness, swelling, sandy sensation of left eye, she was given warm compress, felt like it was getting better initially, but in the past 1 week it got worse again  new onset ptosis of left upper eyelid about the time the lid became irritated, no palpable nodule/calazion felt, pupils appear equal, but felt to be difficult to evaluate due to previous history of cataract surgery, anterior blepharitis all 4 quadrants, erythromycin ointment, left upper eyelid/lashes at that time, OD 20/30-2, OS 20/40+2,  Dr. Alden Hipp refer patients her to rule out left Horner syndrome,   REVIEW OF SYSTEMS: Full 14 system review of systems performed and notable only for as above All other review of systems were negative.  ALLERGIES: Allergies  Allergen Reactions  . Keflex [Cephalexin] Rash    HOME MEDICATIONS: Current Outpatient Medications  Medication Sig Dispense Refill  . Calcium Citrate  (CITRACAL PO) Take 1 tablet by mouth daily.    . dorzolamide-timolol (COSOPT) 22.3-6.8 MG/ML ophthalmic solution Place 1 drop into the left eye 2 (two) times daily.  11  . latanoprost (XALATAN) 0.005 % ophthalmic solution Place 1 drop into both eyes at bedtime.     . Omega-3 Fatty Acids (FISH OIL PO) Take 2 capsules by mouth at bedtime.     Chelsea Aus ER 180 MG 24 hr capsule     . valsartan (DIOVAN) 320 MG tablet Take 320 mg by mouth daily.     No current facility-administered medications for this visit.    PAST MEDICAL HISTORY: Past Medical History:  Diagnosis Date  . Dry eyes   . Glaucoma   . Hypertension   . Ptosis of left eyelid     PAST SURGICAL HISTORY: Past Surgical History:  Procedure Laterality Date  . ABDOMINAL HYSTERECTOMY    . CATARACT EXTRACTION Bilateral   . TRIGGER FINGER RELEASE Right 01/15/2017   Procedure: RELEASE TRIGGER FINGER/A-1 PULLEY RIGHT RING FINGER;  Surgeon: Betha Loa, MD;  Location: [facility];  Service: Orthopedics;  Laterality: Right;    FAMILY HISTORY: Family History  Problem Relation Age of Onset  . Cancer - Colon Mother   . Other Father        unknown history    SOCIAL HISTORY: Social History   Socioeconomic History  . Marital status: Widowed    Spouse name: Not on file  . Number of children: 1  . Years of education: college  . Highest education level: Master's degree (e.g., MA, MS, MEng, MEd, MSW, MBA)  Occupational History  . Occupation: Retired Runner, broadcasting/film/video  Tobacco Use  . Smoking status: Former Smoker    Types: Cigarettes  . Smokeless tobacco: Never Used  . Tobacco comment: quit 40+ years ago  Vaping Use  . Vaping Use: Never used  Substance and Sexual Activity  . Alcohol use: No    Alcohol/week: 0.0 standard drinks  . Drug use: Never  . Sexual activity: Not on file  Other Topics Concern  . Not on file  Social History Narrative   Lives with her daughter.   Right-handed.   One cup coffee per day.   Social  Determinants of Health   Financial Resource Strain:   . Difficulty of Paying Living Expenses:   Food Insecurity:   . Worried About Programme researcher, broadcasting/film/video in the Last Year:   . Barista in the Last Year:   Transportation Needs:   . Freight forwarder (Medical):   Marland Kitchen Lack of Transportation (Non-Medical):   Physical Activity:   . Days of Exercise per Week:   . Minutes of Exercise per Session:   Stress:   . Feeling of Stress :   Social Connections:   . Frequency of Communication with Friends and Family:   . Frequency of Social Gatherings with Friends and Family:   . Attends Religious Services:   . Active Member of Clubs or Organizations:   . Attends Banker Meetings:   Marland Kitchen Marital Status:   Intimate Partner Violence:   . Fear of Current or Ex-Partner:   . Emotionally Abused:   Marland Kitchen Physically Abused:   . Sexually Abused:      PHYSICAL EXAM   Vitals:   06/12/20 0818  Weight: 130 lb (59 kg)  Height: 5\' 3"  (1.6 m)   Blood pressure was significantly elevated to 210//80, heart rate of 61  Body mass index is 23.03 kg/m.  PHYSICAL EXAMNIATION:  Gen: NAD, conversant, well nourised, well groomed                     Cardiovascular: Regular rate rhythm, no peripheral edema, warm, nontender. Eyes: Conjunctivae clear without exudates or hemorrhage Neck: Supple, no carotid bruits. Pulmonary: Clear to auscultation bilaterally   NEUROLOGICAL EXAM:  MENTAL STATUS: Speech:    Speech is normal; fluent and spontaneous with normal comprehension.  Cognition:     Orientation to time, place and person     Normal recent and remote memory     Normal Attention span and concentration     Normal Language, naming, repeating,spontaneous speech     Fund of knowledge   CRANIAL NERVES: CN II: Visual fields are full to confrontation. Pupils are round equal and briskly reactive to light. CN III, IV, VI: extraocular movement are normal.  Static left eye ptosis, mild erythematous  left upper eyelid CN V: Facial sensation is intact to light touch CN VII: Face is symmetric with normal eye closure  CN VIII: Hearing is normal to causal conversation. CN IX, X: Phonation is normal. CN XI: Head turning and shoulder shrug are intact  MOTOR: There is no pronator drift of out-stretched arms. Muscle bulk and tone are normal. Muscle strength is normal.  REFLEXES: Reflexes are 2+ and symmetric at the biceps, triceps, knees, and ankles. Plantar responses are flexor.  SENSORY: Intact to light touch, pinprick and vibratory sensation are intact in fingers and toes.  COORDINATION: There is no trunk or limb dysmetria noted.  GAIT/STANCE: She needs push-up to  get up from seated position, static   DIAGNOSTIC DATA (LABS, IMAGING, TESTING) - I reviewed patient records, labs, notes, testing and imaging myself where available.   ASSESSMENT AND PLAN  AALANI AIKENS is a 86 y.o. female   Acute onset of left ptosis,  Patient reported initially she had left upper eyelid erythematous, painful, much improved with hot compression, creams,  She was noted to have static left ptosis, there was no extraocular eye movement disorder, no eye closure weakness  She was found to have significantly elevated blood pressure 210/80 on multiple measurement today, she was asymptomatic,  MRI of the brain, MRA of the brain and neck to rule out Horner syndrome, central nervous system etiology  Laboratory evaluation including myasthenia gravis panel to rule out neuromuscular junctional disorder  Differentiation diagnosis also including local left upper eyelid pathology.  I have advised her to contact her primary care physician Georgann Housekeeper, MD for better management of her blood pressure.   Levert Feinstein, M.D. Ph.D.  [facility] [address] [place], [place] [address] Ph: [phone] Fax: [phone]  CC:  [facility] [address] [place],  [place] [address]  Georgann Housekeeper, MD

## 2020-06-13 ENCOUNTER — Encounter: Payer: Self-pay | Admitting: Neurology

## 2020-06-19 ENCOUNTER — Telehealth: Payer: Self-pay | Admitting: Neurology

## 2020-06-19 NOTE — Telephone Encounter (Signed)
[employer] medicare no [employer] Auth: [id number] (exp. 06/19/20 to 12/15/20) order sent to [facility]. They will reach out to the patient to schedule.

## 2020-06-25 ENCOUNTER — Telehealth: Payer: Self-pay | Admitting: Neurology

## 2020-06-25 LAB — MYASTHENIA GRAVIS PROFILE
AChR Binding Ab, Serum: <0.03 nmol/L (ref 0.00–0.24)
Acetylchol Block Ab: 14 % (ref 0–25)
Acetylcholine Modulat Ab: <12 % (ref 0–20)
Anti-striation Abs: NEGATIVE

## 2020-06-25 LAB — MUSK ANTIBODIES: MuSK Antibodies: <1 U/mL

## 2020-06-25 LAB — HEMOGLOBIN A1C
Est. average glucose Bld gHb Est-mCnc: 123 mg/dL
Hgb A1c MFr Bld: 5.9 % — ABNORMAL HIGH (ref 4.8–5.6)

## 2020-06-25 LAB — CK: Total CK: 88 U/L (ref 26–161)

## 2020-06-25 LAB — RPR: RPR Ser Ql: NONREACTIVE

## 2020-06-25 LAB — TSH: TSH: 2.45 u[IU]/mL (ref 0.450–4.500)

## 2020-06-25 LAB — C-REACTIVE PROTEIN: CRP: 4 mg/L (ref 0–10)

## 2020-06-25 LAB — SEDIMENTATION RATE: Sed Rate: 15 mm/hr (ref 0–40)

## 2020-06-25 LAB — ANA W/REFLEX IF POSITIVE: Anti Nuclear Antibody (ANA): NEGATIVE

## 2020-06-25 LAB — VITAMIN B12: Vitamin B-12: 825 pg/mL (ref 232–1245)

## 2020-06-25 NOTE — Telephone Encounter (Signed)
Please call patient, the only abnormality on extensive laboratory evaluation is mild elevated A1c 5.9, she should start by diet control.  I have forwarded laboratory evaluation to her primary care physician, Georgann Housekeeper, MD   Also check on her blood pressure, make sure it has been taking care of

## 2020-06-25 NOTE — Telephone Encounter (Signed)
Patient left a voicemail on my phone wanting her MRI's moved up sooner with [facility]. I emailed Film/video editor at [facility] if they would be able to move up her appt. I am waiting to hear from [facility].

## 2020-06-25 NOTE — Telephone Encounter (Signed)
I have spoken to the patient and she verbalized understanding of her lab results. She is agreeable to watch her diet and follow up with PCP. States her BP has been much better. She is keeping a log for her PCP. The last three days she recorded: 143/77, 130/68, 135/71.

## 2020-06-25 NOTE — Telephone Encounter (Signed)
Patient appt has been moved up to 07/02/20 at [facility].

## 2020-07-02 ENCOUNTER — Other Ambulatory Visit: Payer: Self-pay

## 2020-07-02 ENCOUNTER — Ambulatory Visit
Admission: RE | Admit: 2020-07-02 | Discharge: 2020-07-02 | Disposition: A | Discharge status: Home / Self Care | Payer: Medicare Other | Source: Ambulatory Visit | Attending: Neurology | Admitting: Neurology

## 2020-07-02 DIAGNOSIS — H02402 Unspecified ptosis of left eyelid: Secondary | ICD-10-CM

## 2020-07-02 MED ORDER — GADOBENATE DIMEGLUMINE 529 MG/ML IV SOLN
12.0000 mL | Freq: Once | INTRAVENOUS | Status: AC | PRN
  Administered 2020-07-02: 12 mL via INTRAVENOUS

## 2020-07-03 ENCOUNTER — Telehealth: Payer: Self-pay | Admitting: Neurology

## 2020-07-03 NOTE — Telephone Encounter (Signed)
IMPRESSION: This is a normal MR angiogram of the neck arteries with and without contrast IMPRESSION: This MR angiogram of the intracranial arteries shows the following: 1.  There is reduced flow in the P2 segment of the right posterior cerebral artery.  This is probably due to stenosis but could also be artifactual.  Other arteries appear normal. 2.   There are no aneurysms.  IMPRESSION: This MRI of the brain without contrast shows the following: 1.   Extensive T2/FLAIR hyperintense foci in the hemispheres with confluencies noted in the anterior temporal lobes and the periatrial white matter and in the pons and right cerebral peduncle.  These findings are most consistent with chronic microvascular ischemic change.  None of the foci appear to be acute. 2.   Orbits appear normal. 3.   Mild generalized cortical atrophy. 4.   Right hemisphenoid sinusitis. 5.   There were no acute findings.  Please call patient, MRI of the brain showed no acute abnormalities, evidence of chronic microvascular changes, there is also evidence of right sphenoid sinusitis, intracranial atherosclerotic disease, no significant large vessel disease  Please check on patient, if her left side droopy eyelid has improved, also check to see if she has any signs of active sinusitis, such as frequent headaches, fever, nasal congestion, if she has any of those, may consider ENT evaluation,  None of above findings would explain her left side droopy eyelid

## 2020-07-03 NOTE — Telephone Encounter (Signed)
I called the pt and we discussed the findings for the MRI/MRA.  Pt verbalized understanding. Pt reports some sinusitis related sx at present ( ex. sinus congestion and post nasal drip) but declined ENT referral at this time.  Pt reports she will call back if she would like to pursue this.

## 2020-07-15 ENCOUNTER — Other Ambulatory Visit: Payer: Medicare Other

## 2020-07-16 DIAGNOSIS — M8580 Other specified disorders of bone density and structure, unspecified site: Secondary | ICD-10-CM | POA: Diagnosis not present

## 2020-07-16 DIAGNOSIS — R7309 Other abnormal glucose: Secondary | ICD-10-CM | POA: Diagnosis not present

## 2020-07-16 DIAGNOSIS — Z0000 Encounter for general adult medical examination without abnormal findings: Secondary | ICD-10-CM | POA: Diagnosis not present

## 2020-07-16 DIAGNOSIS — I10 Essential (primary) hypertension: Secondary | ICD-10-CM | POA: Diagnosis not present

## 2020-08-03 DIAGNOSIS — I10 Essential (primary) hypertension: Secondary | ICD-10-CM | POA: Diagnosis not present

## 2020-08-16 DIAGNOSIS — I10 Essential (primary) hypertension: Secondary | ICD-10-CM | POA: Diagnosis not present

## 2020-08-27 ENCOUNTER — Ambulatory Visit (INDEPENDENT_AMBULATORY_CARE_PROVIDER_SITE_OTHER): Payer: Medicare Other | Admitting: Neurology

## 2020-08-27 ENCOUNTER — Encounter: Payer: Self-pay | Admitting: Neurology

## 2020-08-27 ENCOUNTER — Other Ambulatory Visit: Payer: Self-pay

## 2020-08-27 VITALS — BP 142/64 | HR 57 | Ht 63.0 in | Wt 131.0 lb

## 2020-08-27 DIAGNOSIS — H02402 Unspecified ptosis of left eyelid: Secondary | ICD-10-CM

## 2020-08-27 NOTE — Progress Notes (Signed)
HISTORICAL  Sheila Spears is a 86 year old female, seen in request by her ophthalmologist Dr. Carolynn Sayers for evaluation of left side droopy eyelid, her primary  care physician is Dr. Lysle Rubens, [place], she drove herself to clinic, alone at today's visit on June 11, 2020  I reviewed and summarized the referring note.PMH HTN  She lives with her daughter, she is independent in her daily activity, actually taking care of her daughter who has suffered kidney failure  She woke up 1 morning in April 2021, noticed painful left upper eyelid swelling, turning red, crusting of her left eye, thought she had a stye in her left upper eyelid, her symptoms did improve with hot compression, ointment, but still has persistent left ptosis, she denies visual change, she can read her newspaper without much difficulty,  She denies bulbar weakness, no dysarthria, no dysphagia, no gait abnormality.  I reviewed ophthalmology evaluation from [facility] on Apr 04, 2020, described left upper eyelid swelling, also crusting of left eye, redness, swelling, sandy sensation of left eye, she was given warm compress, felt like it was getting better initially, but in the past 1 week it got worse again  new onset ptosis of left upper eyelid about the time the lid became irritated, no palpable nodule/calazion felt, pupils appear equal, but felt to be difficult to evaluate due to previous history of cataract surgery, anterior blepharitis all 4 quadrants, erythromycin ointment, left upper eyelid/lashes at that time, OD 20/30-2, OS 20/40+2,  Dr. Carolynn Sayers refer patients her to rule out left Horner syndrome,   UPDATE Aug 27 2020: She continue has mild left ptosis, no pain, no significant worsening, no visual loss  Personally reviewed MRI of the brain without contrast on July 02, 2020, extensive T2/FLAIR hyperintensity foci in the hemisphere confluence in the anterior temporal lobe, in the parietal white matter, in the pons,  and right cerebellar peduncle, most consistent with chronic microvascular small vessel disease, mild generalized atrophy  MRA of brain and neck showed no large vessel disease, intracranial atherosclerotic disease.  Laboratory evaluation showed MuSK antibody less than 1, negative acetylcholine receptor antibody, A1c 5.9, normal or negative ANA, ESR, C-reactive protein, CPK, RPR, TSH, B12,   REVIEW OF SYSTEMS: Full 14 system review of systems performed and notable only for as above All other review of systems were negative.  ALLERGIES: Allergies  Allergen Reactions  . Keflex [Cephalexin] Rash    HOME MEDICATIONS: Current Outpatient Medications  Medication Sig Dispense Refill  . amLODipine (NORVASC) 2.5 MG tablet Take 2.5 mg by mouth at bedtime.     . Calcium Citrate (CITRACAL PO) Take 1 tablet by mouth daily.    . dorzolamide-timolol (COSOPT) 22.3-6.8 MG/ML ophthalmic solution Place 1 drop into the left eye 2 (two) times daily.  11  . latanoprost (XALATAN) 0.005 % ophthalmic solution Place 1 drop into both eyes at bedtime.     . Omega-3 Fatty Acids (FISH OIL PO) Take 2 capsules by mouth at bedtime.     Deborah Chalk ER 180 MG 24 hr capsule     . valsartan (DIOVAN) 320 MG tablet Take 320 mg by mouth daily.     No current facility-administered medications for this visit.    PAST MEDICAL HISTORY: Past Medical History:  Diagnosis Date  . Dry eyes   . Glaucoma   . Hypertension   . Ptosis of left eyelid     PAST SURGICAL HISTORY: Past Surgical History:  Procedure Laterality Date  . ABDOMINAL  HYSTERECTOMY    . CATARACT EXTRACTION Bilateral   . TRIGGER FINGER RELEASE Right 01/15/2017   Procedure: RELEASE TRIGGER FINGER/A-1 PULLEY RIGHT RING FINGER;  Surgeon: Leanora Cover, MD;  Location: [facility];  Service: Orthopedics;  Laterality: Right;    FAMILY HISTORY: Family History  Problem Relation Age of Onset  . Cancer - Colon Mother   . Other Father        unknown  history    SOCIAL HISTORY: Social History   Socioeconomic History  . Marital status: Widowed    Spouse name: Not on file  . Number of children: 1  . Years of education: college  . Highest education level: Master's degree (e.g., MA, MS, MEng, MEd, MSW, MBA)  Occupational History  . Occupation: Retired Pharmacist, hospital  Tobacco Use  . Smoking status: Former Smoker    Types: Cigarettes  . Smokeless tobacco: Never Used  . Tobacco comment: quit 40+ years ago  Vaping Use  . Vaping Use: Never used  Substance and Sexual Activity  . Alcohol use: No    Alcohol/week: 0.0 standard drinks  . Drug use: Never  . Sexual activity: Not on file  Other Topics Concern  . Not on file  Social History Narrative   Lives with her daughter.   Right-handed.   One cup coffee per day.   Social Determinants of Health   Financial Resource Strain:   . Difficulty of Paying Living Expenses: Not on file  Food Insecurity:   . Worried About Charity fundraiser in the Last Year: Not on file  . Ran Out of Food in the Last Year: Not on file  Transportation Needs:   . Lack of Transportation (Medical): Not on file  . Lack of Transportation (Non-Medical): Not on file  Physical Activity:   . Days of Exercise per Week: Not on file  . Minutes of Exercise per Session: Not on file  Stress:   . Feeling of Stress : Not on file  Social Connections:   . Frequency of Communication with Friends and Family: Not on file  . Frequency of Social Gatherings with Friends and Family: Not on file  . Attends Religious Services: Not on file  . Active Member of Clubs or Organizations: Not on file  . Attends Archivist Meetings: Not on file  . Marital Status: Not on file  Intimate Partner Violence:   . Fear of Current or Ex-Partner: Not on file  . Emotionally Abused: Not on file  . Physically Abused: Not on file  . Sexually Abused: Not on file     PHYSICAL EXAM   Vitals:   08/27/20 1135  BP: (!) 142/64  Pulse: (!)  57  Weight: 131 lb (59.4 kg)  Height: 5' 3"  (1.6 m)   Blood pressure was significantly elevated to 210//80, heart rate of 61  Body mass index is 23.21 kg/m.  PHYSICAL EXAMNIATION:  Gen: NAD, conversant, well nourised, well groomed                     Cardiovascular: Regular rate rhythm, no peripheral edema, warm, nontender. Eyes: Conjunctivae clear without exudates or hemorrhage Neck: Supple, no carotid bruits. Pulmonary: Clear to auscultation bilaterally   NEUROLOGICAL EXAM:  MENTAL STATUS: Speech:    Speech is normal; fluent and spontaneous with normal comprehension.  Cognition:     Orientation to time, place and person     Normal recent and remote memory     Normal  Attention span and concentration     Normal Language, naming, repeating,spontaneous speech     Fund of knowledge   CRANIAL NERVES: CN II: Visual fields are full to confrontation. Pupils are round equal and briskly reactive to light. CN III, IV, VI: extraocular movement are normal.  Static left eye ptosis, mild erythematous left upper eyelid CN V: Facial sensation is intact to light touch CN VII: Face is symmetric with normal eye closure  CN VIII: Hearing is normal to causal conversation. CN IX, X: Phonation is normal. CN XI: Head turning and shoulder shrug are intact  MOTOR: There is no pronator drift of out-stretched arms. Muscle bulk and tone are normal. Muscle strength is normal.  REFLEXES: Reflexes are 2+ and symmetric at the biceps, triceps, knees, and ankles. Plantar responses are flexor.  SENSORY: Intact to light touch, pinprick and vibratory sensation are intact in fingers and toes.  COORDINATION: There is no trunk or limb dysmetria noted.  GAIT/STANCE: She needs push-up to get up from seated position, static   DIAGNOSTIC DATA (LABS, IMAGING, TESTING) - I reviewed patient records, labs, notes, testing and imaging myself where available.   ASSESSMENT AND PLAN  Sheila Spears is a 86  y.o. female   Acute onset persistent left ptosis,  Patient reported initially she had left upper eyelid erythematous, painful, much improved with hot compression, creams,  She was noted to have static left ptosis, there was no extraocular eye movement disorder, no eye closure weakness  MRI of the brain showed generalized atrophy, supratentorium small vessel disease,  MRI of brain and neck showed no large vessel disease, mild intracranial atherosclerotic disease.  Most likely due to local left upper eyelid pathology.      Marcial Pacas, M.D. Ph.D.  [facility] [address] [place], [place] [address] Ph: [phone] Fax: [phone]  CC:  Wenda Low, [address] [place],  [place] [id number]  Wenda Low, MD

## 2020-09-11 ENCOUNTER — Other Ambulatory Visit: Payer: Self-pay

## 2020-09-11 ENCOUNTER — Ambulatory Visit (INDEPENDENT_AMBULATORY_CARE_PROVIDER_SITE_OTHER): Payer: Medicare Other | Admitting: Podiatry

## 2020-09-11 ENCOUNTER — Encounter: Payer: Self-pay | Admitting: Podiatry

## 2020-09-11 DIAGNOSIS — M79675 Pain in left toe(s): Secondary | ICD-10-CM | POA: Diagnosis not present

## 2020-09-11 DIAGNOSIS — M79674 Pain in right toe(s): Secondary | ICD-10-CM | POA: Diagnosis not present

## 2020-09-11 DIAGNOSIS — B351 Tinea unguium: Secondary | ICD-10-CM | POA: Diagnosis not present

## 2020-09-16 NOTE — Progress Notes (Signed)
Subjective:  Patient ID: Sheila Spears, female    DOB: 01-15-1922,  MRN: [id number]  Sheila Spears presents to clinic today for painful thick toenails that are difficult to trim. Pain interferes with ambulation. Aggravating factors include wearing enclosed shoe gear. Pain is relieved with periodic professional debridement..  86 y.o. female presents with the above complaint.    Review of Systems: Negative except as noted in the HPI. Past Medical History:  Diagnosis Date  . Dry eyes   . Glaucoma   . Hypertension   . Ptosis of left eyelid    Past Surgical History:  Procedure Laterality Date  . ABDOMINAL HYSTERECTOMY    . CATARACT EXTRACTION Bilateral   . TRIGGER FINGER RELEASE Right 01/15/2017   Procedure: RELEASE TRIGGER FINGER/A-1 PULLEY RIGHT RING FINGER;  Surgeon: Betha Loa, MD;  Location: [facility];  Service: Orthopedics;  Laterality: Right;    Current Outpatient Medications:  .  amLODipine (NORVASC) 2.5 MG tablet, Take 2.5 mg by mouth at bedtime. , Disp: , Rfl:  .  Calcium Citrate (CITRACAL PO), Take 1 tablet by mouth daily., Disp: , Rfl:  .  dorzolamide-timolol (COSOPT) 22.3-6.8 MG/ML ophthalmic solution, Place 1 drop into the left eye 2 (two) times daily., Disp: , Rfl: 11 .  latanoprost (XALATAN) 0.005 % ophthalmic solution, Place 1 drop into both eyes at bedtime. , Disp: , Rfl:  .  Omega-3 Fatty Acids (FISH OIL PO), Take 2 capsules by mouth at bedtime. , Disp: , Rfl:  .  TIADYLT ER 180 MG 24 hr capsule, , Disp: , Rfl:  .  valsartan (DIOVAN) 320 MG tablet, Take 320 mg by mouth daily., Disp: , Rfl:  Allergies  Allergen Reactions  . Keflex [Cephalexin] Rash   Social History   Occupational History  . Occupation: Retired Runner, broadcasting/film/video  Tobacco Use  . Smoking status: Former Smoker    Types: Cigarettes  . Smokeless tobacco: Never Used  . Tobacco comment: quit 40+ years ago  Vaping Use  . Vaping Use: Never used  Substance and Sexual Activity  . Alcohol  use: No    Alcohol/week: 0.0 standard drinks  . Drug use: Never  . Sexual activity: Not on file    Objective:   Constitutional Sheila Spears is a pleasant 86 y.o. African American female, in NAD. AAO x 3.   Vascular Capillary refill time to digits immediate b/l. Palpable pedal pulses b/l LE. Pedal hair present. Lower extremity skin temperature gradient within normal limits. No cyanosis or clubbing noted.  Neurologic Normal speech. Oriented to person, place, and time. Protective sensation intact 5/5 intact bilaterally with 10g monofilament b/l. Vibratory sensation intact b/l.  Dermatologic Pedal skin with normal turgor, texture and tone bilaterally. No open wounds bilaterally. No interdigital macerations bilaterally. Toenails 1-5 b/l elongated, discolored, dystrophic, thickened, crumbly with subungual debris and tenderness to dorsal palpation.  Orthopedic: Normal muscle strength 5/5 to all lower extremity muscle groups bilaterally. No pain crepitus or joint limitation noted with ROM b/l. No gross bony deformities bilaterally.   Radiographs: None Assessment:   1. Pain due to onychomycosis of toenails of both feet    Plan:  Patient was evaluated and treated and all questions answered.  Onychomycosis with pain -Nails palliatively debridement as below -Educated on self-care  Procedure: Nail Debridement Rationale: Pain Type of Debridement: manual, sharp debridement. Instrumentation: Nail nipper, rotary burr. Number of Nails: 10 -Examined patient. -No new findings. No new orders. -Toenails 1-5 b/l were debrided in length  and girth with sterile nail nippers and dremel without iatrogenic bleeding.  -Patient to report any pedal injuries to medical professional immediately. -Patient to continue soft, supportive shoe gear daily. -Patient/POA to call should there be question/concern in the interim.  No follow-ups on file.  Freddie Breech, DPM

## 2020-10-02 ENCOUNTER — Ambulatory Visit: Payer: Medicare Other | Attending: Internal Medicine

## 2020-10-02 DIAGNOSIS — Z23 Encounter for immunization: Secondary | ICD-10-CM

## 2020-10-02 NOTE — Progress Notes (Signed)
   Covid-19 Vaccination Clinic  Name:  Sheila Spears    MRN: [id number] DOB: 1922/03/19  10/02/2020  Sheila Spears was observed post Covid-19 immunization for 15 minutes without incident. She was provided with Vaccine Information Sheet and instruction to access the V-Safe system.   Sheila Spears was instructed to call 911 with any severe reactions post vaccine: Marland Kitchen Difficulty breathing  . Swelling of face and throat  . A fast heartbeat  . A bad rash all over body  . Dizziness and weakness

## 2020-11-08 DIAGNOSIS — H401112 Primary open-angle glaucoma, right eye, moderate stage: Secondary | ICD-10-CM | POA: Diagnosis not present

## 2020-11-08 DIAGNOSIS — H401123 Primary open-angle glaucoma, left eye, severe stage: Secondary | ICD-10-CM | POA: Diagnosis not present

## 2020-11-14 DIAGNOSIS — M65321 Trigger finger, right index finger: Secondary | ICD-10-CM | POA: Diagnosis not present

## 2020-11-14 DIAGNOSIS — M79641 Pain in right hand: Secondary | ICD-10-CM | POA: Diagnosis not present

## 2020-11-14 DIAGNOSIS — M654 Radial styloid tenosynovitis [de Quervain]: Secondary | ICD-10-CM | POA: Diagnosis not present

## 2020-11-14 DIAGNOSIS — M79642 Pain in left hand: Secondary | ICD-10-CM | POA: Diagnosis not present

## 2020-12-18 ENCOUNTER — Other Ambulatory Visit: Payer: Self-pay

## 2020-12-18 ENCOUNTER — Ambulatory Visit: Payer: Medicare Other | Admitting: Podiatry

## 2020-12-18 ENCOUNTER — Encounter: Payer: Self-pay | Admitting: Podiatry

## 2020-12-18 DIAGNOSIS — M79675 Pain in left toe(s): Secondary | ICD-10-CM

## 2020-12-18 DIAGNOSIS — B351 Tinea unguium: Secondary | ICD-10-CM | POA: Diagnosis not present

## 2020-12-18 DIAGNOSIS — M79674 Pain in right toe(s): Secondary | ICD-10-CM | POA: Diagnosis not present

## 2020-12-22 NOTE — Progress Notes (Signed)
Subjective:  Patient ID: Sheila Spears, female    DOB: January 25, 1922,  MRN: [id number]  ALEASE FAIT presents to clinic today for painful thick toenails that are difficult to trim. Pain interferes with ambulation. Aggravating factors include wearing enclosed shoe gear. Pain is relieved with periodic professional debridement..  She voices no new pedal complaints on today's visit.  Review of Systems: Negative except as noted in the HPI. Past Medical History:  Diagnosis Date  . Dry eyes   . Glaucoma   . Hypertension   . Ptosis of left eyelid    Past Surgical History:  Procedure Laterality Date  . ABDOMINAL HYSTERECTOMY    . CATARACT EXTRACTION Bilateral   . TRIGGER FINGER RELEASE Right 01/15/2017   Procedure: RELEASE TRIGGER FINGER/A-1 PULLEY RIGHT RING FINGER;  Surgeon: Betha Loa, MD;  Location: [facility];  Service: Orthopedics;  Laterality: Right;    Current Outpatient Medications:  .  betamethasone acetate-betamethasone sodium phosphate (CELESTONE) 6 (3-3) MG/ML injection, Inject into the articular space., Disp: , Rfl:  .  lidocaine (XYLOCAINE) 1 % (with preservative) injection, by Infiltration route., Disp: , Rfl:  .  amLODipine (NORVASC) 2.5 MG tablet, Take 2.5 mg by mouth at bedtime. , Disp: , Rfl:  .  Calcium Citrate (CITRACAL PO), Take 1 tablet by mouth daily., Disp: , Rfl:  .  dorzolamide-timolol (COSOPT) 22.3-6.8 MG/ML ophthalmic solution, Place 1 drop into the left eye 2 (two) times daily., Disp: , Rfl: 11 .  erythromycin ophthalmic ointment, APPLY A SMALL AMOUNT ON EYELID AT BEDTIME, Disp: , Rfl:  .  latanoprost (XALATAN) 0.005 % ophthalmic solution, Place 1 drop into both eyes at bedtime. , Disp: , Rfl:  .  Omega-3 Fatty Acids (FISH OIL PO), Take 2 capsules by mouth at bedtime. , Disp: , Rfl:  .  TIADYLT ER 180 MG 24 hr capsule, , Disp: , Rfl:  .  valsartan (DIOVAN) 320 MG tablet, Take 320 mg by mouth daily., Disp: , Rfl:  Allergies  Allergen  Reactions  . Keflex [Cephalexin] Rash   Social History   Occupational History  . Occupation: Retired Runner, broadcasting/film/video  Tobacco Use  . Smoking status: Former Smoker    Types: Cigarettes  . Smokeless tobacco: Never Used  . Tobacco comment: quit 40+ years ago  Vaping Use  . Vaping Use: Never used  Substance and Sexual Activity  . Alcohol use: No    Alcohol/week: 0.0 standard drinks  . Drug use: Never  . Sexual activity: Not on file    Objective:   Constitutional Sheila Spears is a pleasant 87 y.o. African American female, in NAD. AAO x 3.   Vascular Capillary refill time to digits immediate b/l. Palpable pedal pulses b/l LE. Pedal hair present. Lower extremity skin temperature gradient within normal limits. No cyanosis or clubbing noted.  Neurologic Normal speech. Oriented to person, place, and time. Protective sensation intact 5/5 intact bilaterally with 10g monofilament b/l. Vibratory sensation intact b/l.  Dermatologic Pedal skin with normal turgor, texture and tone bilaterally. No open wounds bilaterally. No interdigital macerations bilaterally. Toenails 1-5 b/l elongated, discolored, dystrophic, thickened, crumbly with subungual debris and tenderness to dorsal palpation.  Orthopedic: Normal muscle strength 5/5 to all lower extremity muscle groups bilaterally. No pain crepitus or joint limitation noted with ROM b/l. No gross bony deformities bilaterally.   Radiographs: None Assessment:   1. Pain due to onychomycosis of toenails of both feet    Plan:  Patient was evaluated and treated  and all questions answered.  Onychomycosis with pain -Nails palliatively debridement as below -Educated on self-care  Procedure: Nail Debridement Rationale: Pain Type of Debridement: manual, sharp debridement. Instrumentation: Nail nipper, rotary burr. Number of Nails: 10 -Examined patient. -No new findings. No new orders. -Toenails 1-5 b/l were debrided in length and girth with sterile nail  nippers and dremel without iatrogenic bleeding.  -Patient to report any pedal injuries to medical professional immediately. -Patient to continue soft, supportive shoe gear daily. -Patient/POA to call should there be question/concern in the interim.  Return in about 3 months (around 03/18/2021).  Sheila Spears, DPM

## 2020-12-26 DIAGNOSIS — M65331 Trigger finger, right middle finger: Secondary | ICD-10-CM | POA: Diagnosis not present

## 2020-12-26 DIAGNOSIS — M654 Radial styloid tenosynovitis [de Quervain]: Secondary | ICD-10-CM | POA: Diagnosis not present

## 2021-01-16 DIAGNOSIS — H409 Unspecified glaucoma: Secondary | ICD-10-CM | POA: Diagnosis not present

## 2021-01-16 DIAGNOSIS — E46 Unspecified protein-calorie malnutrition: Secondary | ICD-10-CM | POA: Diagnosis not present

## 2021-01-16 DIAGNOSIS — R7303 Prediabetes: Secondary | ICD-10-CM | POA: Diagnosis not present

## 2021-01-16 DIAGNOSIS — I10 Essential (primary) hypertension: Secondary | ICD-10-CM | POA: Diagnosis not present

## 2021-01-25 DIAGNOSIS — Z1231 Encounter for screening mammogram for malignant neoplasm of breast: Secondary | ICD-10-CM | POA: Diagnosis not present

## 2021-02-04 DIAGNOSIS — M654 Radial styloid tenosynovitis [de Quervain]: Secondary | ICD-10-CM | POA: Diagnosis not present

## 2021-04-02 ENCOUNTER — Encounter: Payer: Self-pay | Admitting: Podiatry

## 2021-04-02 ENCOUNTER — Other Ambulatory Visit: Payer: Self-pay

## 2021-04-02 ENCOUNTER — Ambulatory Visit: Payer: Medicare Other | Admitting: Podiatry

## 2021-04-02 DIAGNOSIS — M1990 Unspecified osteoarthritis, unspecified site: Secondary | ICD-10-CM | POA: Insufficient documentation

## 2021-04-02 DIAGNOSIS — M79675 Pain in left toe(s): Secondary | ICD-10-CM | POA: Diagnosis not present

## 2021-04-02 DIAGNOSIS — B351 Tinea unguium: Secondary | ICD-10-CM

## 2021-04-02 DIAGNOSIS — H409 Unspecified glaucoma: Secondary | ICD-10-CM | POA: Insufficient documentation

## 2021-04-02 DIAGNOSIS — M79674 Pain in right toe(s): Secondary | ICD-10-CM

## 2021-04-02 DIAGNOSIS — R7303 Prediabetes: Secondary | ICD-10-CM | POA: Insufficient documentation

## 2021-04-02 DIAGNOSIS — E46 Unspecified protein-calorie malnutrition: Secondary | ICD-10-CM | POA: Insufficient documentation

## 2021-04-02 DIAGNOSIS — R059 Cough, unspecified: Secondary | ICD-10-CM | POA: Insufficient documentation

## 2021-04-07 NOTE — Progress Notes (Signed)
  Subjective:  Patient ID: Sheila Spears, female    DOB: [birth year],  MRN: [id number]  87 y.o. female presents painful thick toenails that are difficult to trim. Pain interferes with ambulation. Aggravating factors include wearing enclosed shoe gear. Pain is relieved with periodic professional debridement.   She is here with her daughter on today's visit. Daughter also has an appointment with me.  Ms. Foulk voices no new pedal concerns on today's visit.  Allergies  Allergen Reactions  . Hydrochlorothiazide     Other reaction(s): leg cramps  . Keflex [Cephalexin] Rash    Review of Systems: Negative except as noted in the HPI.   Objective:   Constitutional Pt is a pleasant 87 y.o. African American female WD, WN in NAD. AAO x 3.   Vascular Capillary refill time to digits immediate b/l. Palpable pedal pulses b/l LE. Pedal hair present. Lower extremity skin temperature gradient within normal limits. No pain with calf compression b/l. No edema noted b/l lower extremities.  Neurologic Protective sensation intact 5/5 intact bilaterally with 10g monofilament b/l. Vibratory sensation intact b/l.  Dermatologic Pedal skin with normal turgor, texture and tone bilaterally. No open wounds bilaterally. No interdigital macerations bilaterally. Toenails 1-5 b/l elongated, discolored, dystrophic, thickened, crumbly with subungual debris and tenderness to dorsal palpation.  Orthopedic: Normal muscle strength 5/5 to all lower extremity muscle groups bilaterally. No pain crepitus or joint limitation noted with ROM b/l. No gross bony deformities bilaterally.   Radiographs: None Assessment:   1. Pain due to onychomycosis of toenails of both feet    Plan:  Patient was evaluated and treated and all questions answered.  Onychomycosis with pain -Nails palliatively debridement as below. -Educated on self-care  Procedure: Nail Debridement Rationale: Pain Type of Debridement: manual, sharp  debridement. Instrumentation: Nail nipper, rotary burr. Number of Nails: 10  -Examined patient. -No new findings. No new orders. -Patient to continue soft, supportive shoe gear daily. -Toenails 1-5 b/l were debrided in length and girth with sterile nail nippers and dremel without iatrogenic bleeding.  -Patient to report any pedal injuries to medical professional immediately. -Patient/POA to call should there be question/concern in the interim.  Return in about 3 months (around 07/03/2021).  Freddie Breech, DPM

## 2021-04-17 ENCOUNTER — Ambulatory Visit: Payer: Self-pay | Admitting: *Deleted

## 2021-04-17 NOTE — Telephone Encounter (Signed)
Pt reports she had pneumonia and shingles vaccine yesterday at [facility]. States last night had lightheadedness, urinary urgency and frequency. "Off balance" not presently. States called pharmacist at [facility], states they can be S/S of vaccine reaction. Also reports achy. Neighbor present with pt. States she gave her tylenol this AM per pharmacist instructions. BP checked during call, 133/70  HR 82. Advised pt to alert PCP. Advised ED for worsening symptoms. Care advise per dizziness protocol. Pt verbalizes understanding.   Reason for Disposition . [1] MODERATE dizziness (e.g., interferes with normal activities) AND [2] has NOT been evaluated by physician for this  (Exception: dizziness caused by heat exposure, sudden standing, or poor fluid intake)  Answer Assessment - Initial Assessment Questions 1. DESCRIPTION: "Describe your dizziness."     Lightheaded 2. LIGHTHEADED: "Do you feel lightheaded?" (e.g., somewhat faint, woozy, weak upon standing)    Not now. 3. VERTIGO: "Do you feel like either you or the room is spinning or tilting?" (i.e. vertigo)     no 4. SEVERITY: "How bad is it?"  "Do you feel like you are going to faint?" "Can you stand and walk?"   - MILD: Feels slightly dizzy, but walking normally.   - MODERATE: Feels unsteady when walking, but not falling; interferes with normal activities (e.g., school, work).   - SEVERE: Unable to walk without falling, or requires assistance to walk without falling; feels like passing out now.      Mild, none presently 5. ONSET:  "When did the dizziness begin?"    Last night 6. AGGRAVATING FACTORS: "Does anything make it worse?" (e.g., standing, change in head position)     walking 7. HEART RATE: "Can you tell me your heart rate?" "How many beats in 15 seconds?"  (Note: not all patients can do this)       82  BP 133/70 8. CAUSE: "What do you think is causing the dizziness?"     Vaccines of yesterday 9. RECURRENT SYMPTOM: "Have you had dizziness before?"  If Yes, ask: "When was the last time?" "What happened that time?"    10. OTHER SYMPTOMS: "Do you have any other symptoms?" (e.g., fever, chest pain, vomiting, diarrhea, bleeding)       Urinary urgency and frequency last night.  Protocols used: DIZZINESS [facility]

## 2021-04-18 DIAGNOSIS — R350 Frequency of micturition: Secondary | ICD-10-CM | POA: Diagnosis not present

## 2021-05-09 DIAGNOSIS — H401112 Primary open-angle glaucoma, right eye, moderate stage: Secondary | ICD-10-CM | POA: Diagnosis not present

## 2021-05-09 DIAGNOSIS — H0102A Squamous blepharitis right eye, upper and lower eyelids: Secondary | ICD-10-CM | POA: Diagnosis not present

## 2021-05-09 DIAGNOSIS — H0102B Squamous blepharitis left eye, upper and lower eyelids: Secondary | ICD-10-CM | POA: Diagnosis not present

## 2021-05-09 DIAGNOSIS — H401123 Primary open-angle glaucoma, left eye, severe stage: Secondary | ICD-10-CM | POA: Diagnosis not present

## 2021-06-21 DIAGNOSIS — I10 Essential (primary) hypertension: Secondary | ICD-10-CM | POA: Diagnosis not present

## 2021-06-21 DIAGNOSIS — H409 Unspecified glaucoma: Secondary | ICD-10-CM | POA: Diagnosis not present

## 2021-06-21 DIAGNOSIS — R7303 Prediabetes: Secondary | ICD-10-CM | POA: Diagnosis not present

## 2021-06-21 DIAGNOSIS — M1990 Unspecified osteoarthritis, unspecified site: Secondary | ICD-10-CM | POA: Diagnosis not present

## 2021-07-09 ENCOUNTER — Ambulatory Visit: Payer: Medicare Other | Admitting: Podiatry

## 2021-07-09 ENCOUNTER — Other Ambulatory Visit: Payer: Self-pay

## 2021-07-09 ENCOUNTER — Encounter: Payer: Self-pay | Admitting: Podiatry

## 2021-07-09 DIAGNOSIS — M79675 Pain in left toe(s): Secondary | ICD-10-CM | POA: Diagnosis not present

## 2021-07-09 DIAGNOSIS — B351 Tinea unguium: Secondary | ICD-10-CM | POA: Diagnosis not present

## 2021-07-09 DIAGNOSIS — M79674 Pain in right toe(s): Secondary | ICD-10-CM | POA: Diagnosis not present

## 2021-07-13 NOTE — Progress Notes (Signed)
Subjective: Sheila Spears is a pleasant 87 y.o. female patient seen today painful thick toenails that are difficult to trim. Pain interferes with ambulation. Aggravating factors include wearing enclosed shoe gear. Pain is relieved with periodic professional debridement.  PCP is Georgann Housekeeper, MD. Last visit was: 01/16/2021.  Allergies  Allergen Reactions   Hydrochlorothiazide     Other reaction(s): leg cramps   Cephalexin Rash    Other reaction(s): Unknown    Objective: Physical Exam  General: Sheila Spears is a pleasant 87 y.o. African American female, WD, WN in NAD. AAO x 3.   Vascular:  Capillary refill time to digits immediate b/l. Palpable DP pulse(s) b/l lower extremities Palpable PT pulse(s) b/l lower extremities Pedal hair sparse. Lower extremity skin temperature gradient within normal limits. No pain with calf compression b/l. No edema noted b/l lower extremities.  Dermatological:  Pedal skin with normal turgor, texture and tone b/l lower extremities. No open wounds b/l lower extremities. No interdigital macerations b/l lower extremities. Toenails 1-5 b/l elongated, discolored, dystrophic, thickened, crumbly with subungual debris and tenderness to dorsal palpation.  Musculoskeletal:  Normal muscle strength 5/5 to all lower extremity muscle groups bilaterally. No pain crepitus or joint limitation noted with ROM b/l lower extremities. No gross bony deformities b/l lower extremities.  Neurological:  Protective sensation intact 5/5 intact bilaterally with 10g monofilament b/l. Vibratory sensation intact b/l.  Assessment and Plan:  No diagnosis found.   -Examined patient. -No new findings. No new orders. -Patient to continue soft, supportive shoe gear daily. -Toenails 1-5 b/l were debrided in length and girth with sterile nail nippers and dremel without iatrogenic bleeding.  -Patient to report any pedal injuries to medical professional immediately. -Patient/POA to  call should there be question/concern in the interim.  Return in about 3 months (around 10/09/2021).  Freddie Breech, DPM

## 2021-08-06 DIAGNOSIS — M65331 Trigger finger, right middle finger: Secondary | ICD-10-CM | POA: Diagnosis not present

## 2021-09-24 ENCOUNTER — Encounter (HOSPITAL_BASED_OUTPATIENT_CLINIC_OR_DEPARTMENT_OTHER): Payer: Self-pay | Admitting: *Deleted

## 2021-09-24 ENCOUNTER — Other Ambulatory Visit: Payer: Self-pay

## 2021-09-24 ENCOUNTER — Emergency Department (HOSPITAL_BASED_OUTPATIENT_CLINIC_OR_DEPARTMENT_OTHER)
Admission: EM | Admit: 2021-09-24 | Discharge: 2021-09-24 | Disposition: A | Discharge status: Home / Self Care | Payer: Medicare Other | Attending: Student | Admitting: Student

## 2021-09-24 ENCOUNTER — Emergency Department (HOSPITAL_BASED_OUTPATIENT_CLINIC_OR_DEPARTMENT_OTHER): Payer: Medicare Other | Admitting: Radiology

## 2021-09-24 DIAGNOSIS — M79605 Pain in left leg: Secondary | ICD-10-CM | POA: Insufficient documentation

## 2021-09-24 DIAGNOSIS — Z87891 Personal history of nicotine dependence: Secondary | ICD-10-CM | POA: Insufficient documentation

## 2021-09-24 DIAGNOSIS — Z79899 Other long term (current) drug therapy: Secondary | ICD-10-CM | POA: Diagnosis not present

## 2021-09-24 DIAGNOSIS — M5432 Sciatica, left side: Secondary | ICD-10-CM

## 2021-09-24 DIAGNOSIS — M5442 Lumbago with sciatica, left side: Secondary | ICD-10-CM | POA: Diagnosis not present

## 2021-09-24 DIAGNOSIS — M549 Dorsalgia, unspecified: Secondary | ICD-10-CM | POA: Insufficient documentation

## 2021-09-24 DIAGNOSIS — I10 Essential (primary) hypertension: Secondary | ICD-10-CM | POA: Insufficient documentation

## 2021-09-24 DIAGNOSIS — M25552 Pain in left hip: Secondary | ICD-10-CM | POA: Diagnosis not present

## 2021-09-24 MED ORDER — KETOROLAC TROMETHAMINE 15 MG/ML IJ SOLN
15.0000 mg | Freq: Once | INTRAMUSCULAR | Status: AC
  Administered 2021-09-24: 15 mg via INTRAMUSCULAR
  Filled 2021-09-24: qty 1

## 2021-09-24 MED ORDER — LIDOCAINE 4 % EX PTCH
1.0000 | MEDICATED_PATCH | Freq: Two times a day (BID) | CUTANEOUS | 0 refills | Status: AC
Start: 2021-09-24 — End: ?

## 2021-09-24 MED ORDER — ACETAMINOPHEN 500 MG PO TABS
1000.0000 mg | ORAL_TABLET | Freq: Once | ORAL | Status: AC
  Administered 2021-09-24: 1000 mg via ORAL
  Filled 2021-09-24: qty 2

## 2021-09-24 MED ORDER — NAPROXEN 500 MG PO TABS
500.0000 mg | ORAL_TABLET | Freq: Two times a day (BID) | ORAL | 0 refills | Status: AC
Start: 2021-09-24 — End: ?

## 2021-09-24 MED ORDER — LIDOCAINE 5 % EX PTCH
1.0000 | MEDICATED_PATCH | CUTANEOUS | Status: DC
  Administered 2021-09-24: 1 via TRANSDERMAL
  Filled 2021-09-24: qty 1

## 2021-09-24 NOTE — ED Triage Notes (Signed)
Pt developed lower back pain radiating down rt leg yesterday morning. Describes on side of her hip and thigh. States she can walk some.

## 2021-09-24 NOTE — ED Notes (Signed)
Patient ambulatory in hallway with minimal assistance.  

## 2021-09-24 NOTE — ED Notes (Signed)
Since yesterday, left sided flank pain; pt reports has a hx of back pain and thinks she may have did too much yesterday while helping her daughter around the house.

## 2021-09-24 NOTE — ED Provider Notes (Signed)
[facility] EMERGENCY DEPT Provider Note   CSN: [id number] Arrival date & time: 09/24/21  1215     History Chief Complaint  Patient presents with   Back Pain    Sheila Spears is a 87 y.o. female who presents the emergency department for evaluation of left hip pain and leg pain.  Patient currently assists her medically sick daughter and was exerting herself over the last week trying to do so.  She states that today while in the kitchen he had sudden onset left hip pain that shoots down the leg but does not extend past the knee.  No bladder or bowel incontinence, no urinary retention, no saddle anesthesia or weakness of the lower extremity.  No numbness of lower extremity.  Denies trauma to the leg.   Back Pain Associated symptoms: no abdominal pain, no chest pain, no dysuria and no fever       Past Medical History:  Diagnosis Date   Dry eyes    Glaucoma    Hypertension    Ptosis of left eyelid     Patient Active Problem List   Diagnosis Date Noted   Cough 04/02/2021   Glaucoma 04/02/2021   Osteoarthritis 04/02/2021   Prediabetes 04/02/2021   Protein calorie malnutrition (HCC) 04/02/2021   De Quervain's tenosynovitis 11/14/2020   Ptosis of left eyelid 06/12/2020   Low back pain 07/07/2019   Pain in left foot 02/03/2019   Trigger middle finger of left hand 01/12/2019   Closed sternal manubrial dissociation fracture with routine healing 12/21/2017   Impaired fasting glucose 04/14/2017   Microscopic hematuria 04/14/2017   Neutropenia (HCC) 04/14/2017   Osteopenia 04/14/2017   Pain in finger of right hand 02/06/2017   Onychomycosis 06/20/2015   Pain in lower limb 06/20/2015   Cellulitis of right upper extremity    Cellulitis 05/08/2015   Essential hypertension 05/08/2015   Macular erythematous rash, nonblanching 05/08/2015    Past Surgical History:  Procedure Laterality Date   ABDOMINAL HYSTERECTOMY     CATARACT EXTRACTION Bilateral    TRIGGER  FINGER RELEASE Right 01/15/2017   Procedure: RELEASE TRIGGER FINGER/A-1 PULLEY RIGHT RING FINGER;  Surgeon: Betha Loa, MD;  Location: [facility];  Service: Orthopedics;  Laterality: Right;     OB History   No obstetric history on file.     Family History  Problem Relation Age of Onset   Cancer - Colon Mother    Other Father        unknown history    Social History   Tobacco Use   Smoking status: Former    Types: Cigarettes   Smokeless tobacco: Never   Tobacco comments:    quit 40+ years ago  Vaping Use   Vaping Use: Never used  Substance Use Topics   Alcohol use: No    Alcohol/week: 0.0 standard drinks   Drug use: Never    Home Medications Prior to Admission medications   Medication Sig Start Date End Date Taking? Authorizing Provider  amLODipine (NORVASC) 2.5 MG tablet Take 2.5 mg by mouth at bedtime.  08/03/20   [provider]  betamethasone acetate-betamethasone sodium phosphate (CELESTONE) 6 (3-3) MG/ML injection Inject into the articular space. 12/26/20   [provider]  brimonidine (ALPHAGAN) 0.2 % ophthalmic solution Place 1 drop into the left eye 2 (two) times daily. 05/09/21   [provider]  Calcium Citrate (CITRACAL PO) Take 1 tablet by mouth daily.    [provider]  DILT-XR  180 MG 24 hr capsule Take 180 mg by mouth daily. 03/11/21   [provider]  DILTIAZEM HCL PO 1 capsule    [provider]  DORZOLAMIDE HCL OP 1 drop into affected eye    [provider]  dorzolamide-timolol (COSOPT) 22.3-6.8 MG/ML ophthalmic solution Place 1 drop into the left eye 2 (two) times daily. 10/06/17   [provider]  erythromycin ophthalmic ointment APPLY A SMALL AMOUNT ON EYELID AT BEDTIME 07/03/20   [provider]  ferrous sulfate 325 (65 FE) MG tablet 1 tablet    [provider]  latanoprost (XALATAN) 0.005 % ophthalmic solution 1 drop into affected eye in the evening     [provider]  lidocaine (XYLOCAINE) 1 % (with preservative) injection by Infiltration route. 12/26/20   [provider]  Multiple Vitamin (MULTIVITAMINS PO) 1 capsule    [provider]  nitrofurantoin, macrocrystal-monohydrate, (MACROBID) 100 MG capsule Take by mouth. 04/18/21   [provider]  Omega-3 Fatty Acids (FISH OIL) 1000 MG CAPS 2 capsules    [provider]  TIADYLT ER 180 MG 24 hr capsule  05/01/19   [provider]  valsartan (DIOVAN) 320 MG tablet 1 tablet    [provider]  Vitamins A & D (VITAMIN A & D) [id number] units TABS 1 capsule    [provider]  VYZULTA 0.024 % SOLN Apply 1 drop to eye at bedtime. 05/09/21   [provider]    Allergies    Hydrochlorothiazide and Cephalexin  Review of Systems   Review of Systems  Constitutional:  Negative for chills and fever.  HENT:  Negative for ear pain and sore throat.   Eyes:  Negative for pain and visual disturbance.  Respiratory:  Negative for cough and shortness of breath.   Cardiovascular:  Negative for chest pain and palpitations.  Gastrointestinal:  Negative for abdominal pain and vomiting.  Genitourinary:  Negative for dysuria and hematuria.  Musculoskeletal:  Positive for back pain. Negative for arthralgias.  Skin:  Negative for color change and rash.  Neurological:  Negative for seizures and syncope.  All other systems reviewed and are negative.  Physical Exam Updated Vital Signs BP (!) 147/84 (BP Location: Left Arm)   Pulse (!) 102   Temp 98.9 F (37.2 C)   Resp 20   Ht 5\' 3"  (1.6 m)   Wt 55.8 kg   SpO2 98%   BMI 21.79 kg/m   Physical Exam Vitals and nursing note reviewed.  Constitutional:      General: She is not in acute distress.    Appearance: She is well-developed.  HENT:     Head: Normocephalic and atraumatic.  Eyes:     Conjunctiva/sclera: Conjunctivae normal.  Cardiovascular:     Rate and Rhythm: Normal  rate and regular rhythm.     Heart sounds: No murmur heard. Pulmonary:     Effort: Pulmonary effort is normal. No respiratory distress.     Breath sounds: Normal breath sounds.  Abdominal:     Palpations: Abdomen is soft.     Tenderness: There is no abdominal tenderness.  Musculoskeletal:        General: Tenderness (Left hip) present.     Cervical back: Neck supple.  Skin:    General: Skin is warm and dry.  Neurological:     Mental Status: She is alert.    ED Results / Procedures / Treatments   Labs (all labs ordered are listed, but  only abnormal results are displayed) Labs Reviewed - No data to display  EKG None  Radiology No results found.  Procedures Procedures   Medications Ordered in ED Medications  lidocaine (LIDODERM) 5 % 1 patch (1 patch Transdermal Patch Applied 09/24/21 1536)  ketorolac (TORADOL) 15 MG/ML injection 15 mg (15 mg Intramuscular Given 09/24/21 1539)  acetaminophen (TYLENOL) tablet 1,000 mg (1,000 mg Oral Given 09/24/21 1536)    ED Course  I have reviewed the triage vital signs and the nursing notes.  Pertinent labs & imaging results that were available during my care of the patient were reviewed by me and considered in my medical decision making (see chart for details).  Clinical Course as of 09/24/21 1641  Tue Sep 24, 2021  1613 DG Hip Malvin Johns or Wo Pelvis 2-3 Views Left [MK]    Clinical Course User Index [MK] Zaylynn Rickett, Debe Coder, MD   MDM Rules/Calculators/A&P                           Patient seen in the emergency department for evaluation of left leg and hip pain.  Physical exam reveals tenderness over the left hip but is otherwise unremarkable.  Neurologic exam unremarkable.  X-ray of the hip with no fracture.  Patient presentation consistent with sciatica and she received a single dose of Toradol, Tylenol and a lidocaine patch.  Patient pain improved and patient able to ambulate in the emergency department out difficulty.  Patient then  discharged with outpatient follow-up. Final Clinical Impression(s) / ED Diagnoses Final diagnoses:  None    Rx / DC Orders ED Discharge Orders     None        Emmajean Ratledge, Debe Coder, MD 09/24/21 1642

## 2021-09-24 NOTE — ED Notes (Signed)
Patient transported to X-ray 

## 2021-10-15 ENCOUNTER — Other Ambulatory Visit: Payer: Self-pay

## 2021-10-15 ENCOUNTER — Encounter: Payer: Self-pay | Admitting: Podiatry

## 2021-10-15 ENCOUNTER — Ambulatory Visit: Payer: Medicare Other | Admitting: Podiatry

## 2021-10-15 DIAGNOSIS — M79674 Pain in right toe(s): Secondary | ICD-10-CM | POA: Diagnosis not present

## 2021-10-15 DIAGNOSIS — M79675 Pain in left toe(s): Secondary | ICD-10-CM

## 2021-10-15 DIAGNOSIS — B351 Tinea unguium: Secondary | ICD-10-CM

## 2021-10-18 NOTE — Progress Notes (Signed)
  Subjective:  Patient ID: Sheila Spears, female    DOB: September 15, 1922,  MRN: [id number]  Sheila Spears presents to clinic today for painful elongated mycotic toenails 1-5 bilaterally which are tender when wearing enclosed shoe gear. Pain is relieved with periodic professional debridement.  Patient voices no new pedal problems on today's visit.  PCP is Georgann Housekeeper, MD , and last visit was 09/26/2021.  Allergies  Allergen Reactions   Hydrochlorothiazide     Other reaction(s): leg cramps   Cephalexin Rash    Other reaction(s): Unknown    Review of Systems: Negative except as noted in the HPI. Objective:   Constitutional Sheila Spears is a pleasant 87 y.o. African American female, WD, WN in NAD. AAO x 3.   Vascular CFT immediate b/l LE. Palpable DP/PT pulses b/l LE. Digital hair sparse b/l. Skin temperature gradient WNL b/l. No pain with calf compression b/l. No edema noted b/l. No cyanosis or clubbing noted b/l LE.  Neurologic Normal speech. Oriented to person, place, and time. Protective sensation intact 5/5 intact bilaterally with 10g monofilament b/l. Vibratory sensation intact b/l.  Dermatologic Pedal integument with normal turgor, texture and tone b/l LE. No open wounds b/l. No interdigital macerations b/l. Toenails 1-5 b/l elongated, thickened, discolored with subungual debris. +Tenderness with dorsal palpation of nailplates. No hyperkeratotic or porokeratotic lesions present.  Orthopedic: Normal muscle strength 5/5 to all lower extremity muscle groups bilaterally. No pain, crepitus or joint limitation noted with ROM b/l LE. No gross bony pedal deformities b/l. Patient ambulates independently without assistive aids.   Radiographs: None   Assessment:   1. Pain due to onychomycosis of toenails of both feet    Plan:  Patient was evaluated and treated and all questions answered. Consent given for treatment as described below: -Patient to continue soft, supportive shoe gear  daily. -Mycotic toenails 1-5 bilaterally were debrided in length and girth with sterile nail nippers and dremel without incident. -Patient/POA to call should there be question/concern in the interim.  Return in about 3 months (around 01/15/2022).  Freddie Breech, DPM

## 2021-11-13 DIAGNOSIS — H401112 Primary open-angle glaucoma, right eye, moderate stage: Secondary | ICD-10-CM | POA: Diagnosis not present

## 2021-11-13 DIAGNOSIS — H401123 Primary open-angle glaucoma, left eye, severe stage: Secondary | ICD-10-CM | POA: Diagnosis not present

## 2021-12-11 DIAGNOSIS — D709 Neutropenia, unspecified: Secondary | ICD-10-CM | POA: Diagnosis not present

## 2021-12-11 DIAGNOSIS — H409 Unspecified glaucoma: Secondary | ICD-10-CM | POA: Diagnosis not present

## 2021-12-11 DIAGNOSIS — R7303 Prediabetes: Secondary | ICD-10-CM | POA: Diagnosis not present

## 2021-12-11 DIAGNOSIS — I10 Essential (primary) hypertension: Secondary | ICD-10-CM | POA: Diagnosis not present

## 2022-01-24 ENCOUNTER — Ambulatory Visit: Payer: Medicare Other | Admitting: Podiatry

## 2022-01-27 DIAGNOSIS — Z1231 Encounter for screening mammogram for malignant neoplasm of breast: Secondary | ICD-10-CM | POA: Diagnosis not present

## 2022-02-19 ENCOUNTER — Ambulatory Visit: Payer: Medicare Other | Admitting: Podiatry

## 2022-04-01 ENCOUNTER — Ambulatory Visit: Payer: Medicare Other | Admitting: Podiatry

## 2022-04-02 ENCOUNTER — Ambulatory Visit: Payer: Medicare Other | Admitting: Podiatry

## 2022-04-02 DIAGNOSIS — B351 Tinea unguium: Secondary | ICD-10-CM

## 2022-04-02 DIAGNOSIS — M79675 Pain in left toe(s): Secondary | ICD-10-CM

## 2022-04-02 DIAGNOSIS — M79674 Pain in right toe(s): Secondary | ICD-10-CM

## 2022-04-02 NOTE — Progress Notes (Signed)
[facility]  Subjective:  Patient ID: Sheila Spears, female    DOB: Dec 20, 1921,  MRN: [id number]  Sheila Spears presents to clinic today for painful thick toenails that are difficult to trim. Pain interferes with ambulation. Aggravating factors include wearing enclosed shoe gear. Pain is relieved with periodic professional debridement.  New problem(s): None.   PCP is Wenda Low, MD , and last visit was December 11, 2021.  Allergies  Allergen Reactions   Hydrochlorothiazide     Other reaction(s): leg cramps   Cephalexin Rash    Other reaction(s): Unknown    Review of Systems: Negative except as noted in the HPI.  Objective: No changes noted in today's physical examination. Constitutional Sheila Spears is a pleasant 88 y.o. African American female, WD, WN in NAD. AAO x 3.   Vascular CFT immediate b/l LE. Palpable DP/PT pulses b/l LE. Digital hair sparse b/l. Skin temperature gradient WNL b/l. No pain with calf compression b/l. No edema noted b/l. No cyanosis or clubbing noted b/l LE.  Neurologic Normal speech. Oriented to person, place, and time. Protective sensation intact 5/5 intact bilaterally with 10g monofilament b/l. Vibratory sensation intact b/l.  Dermatologic Pedal integument with normal turgor, texture and tone b/l LE. No open wounds b/l. No interdigital macerations b/l. Toenails 1-5 b/l elongated, thickened, discolored with subungual debris. +Tenderness with dorsal palpation of nailplates. No hyperkeratotic or porokeratotic lesions present.  Orthopedic: Normal muscle strength 5/5 to all lower extremity muscle groups bilaterally. No pain, crepitus or joint limitation noted with ROM b/l LE. No gross bony pedal deformities b/l. Patient ambulates independently without assistive aids.   Radiographs: None  Assessment/Plan: 1. Pain due to onychomycosis of toenails of both feet     -Patient was evaluated and treated. All patient's and/or POA's questions/concerns answered on today's  visit. -Toenails 1-5 b/l were debrided in length and girth with sterile nail nippers and dremel without iatrogenic bleeding.  -Patient/POA to call should there be question/concern in the interim.   Return in about 3 months (around 07/03/2022).  Marzetta Board, DPM

## 2022-04-11 ENCOUNTER — Encounter: Payer: Self-pay | Admitting: Podiatry

## 2022-05-14 DIAGNOSIS — H401112 Primary open-angle glaucoma, right eye, moderate stage: Secondary | ICD-10-CM | POA: Diagnosis not present

## 2022-05-14 DIAGNOSIS — H0102B Squamous blepharitis left eye, upper and lower eyelids: Secondary | ICD-10-CM | POA: Diagnosis not present

## 2022-05-14 DIAGNOSIS — H401123 Primary open-angle glaucoma, left eye, severe stage: Secondary | ICD-10-CM | POA: Diagnosis not present

## 2022-05-14 DIAGNOSIS — H0102A Squamous blepharitis right eye, upper and lower eyelids: Secondary | ICD-10-CM | POA: Diagnosis not present

## 2022-06-24 DIAGNOSIS — H409 Unspecified glaucoma: Secondary | ICD-10-CM | POA: Diagnosis not present

## 2022-06-24 DIAGNOSIS — R7303 Prediabetes: Secondary | ICD-10-CM | POA: Diagnosis not present

## 2022-06-24 DIAGNOSIS — Z0000 Encounter for general adult medical examination without abnormal findings: Secondary | ICD-10-CM | POA: Diagnosis not present

## 2022-06-24 DIAGNOSIS — I10 Essential (primary) hypertension: Secondary | ICD-10-CM | POA: Diagnosis not present

## 2022-06-24 DIAGNOSIS — E46 Unspecified protein-calorie malnutrition: Secondary | ICD-10-CM | POA: Diagnosis not present

## 2022-06-24 DIAGNOSIS — Z1331 Encounter for screening for depression: Secondary | ICD-10-CM | POA: Diagnosis not present

## 2022-07-08 ENCOUNTER — Encounter: Payer: Self-pay | Admitting: Podiatry

## 2022-07-08 ENCOUNTER — Ambulatory Visit: Payer: Medicare Other | Admitting: Podiatry

## 2022-07-08 DIAGNOSIS — M79674 Pain in right toe(s): Secondary | ICD-10-CM

## 2022-07-08 DIAGNOSIS — M79675 Pain in left toe(s): Secondary | ICD-10-CM

## 2022-07-08 DIAGNOSIS — B351 Tinea unguium: Secondary | ICD-10-CM

## 2022-07-13 NOTE — Progress Notes (Signed)
  Subjective:  Patient ID: Sheila Spears, female    DOB: 1922-11-24,  MRN: [id number]  Sheila Spears presents to clinic today for painful elongated mycotic toenails 1-5 bilaterally which are tender when wearing enclosed shoe gear. Pain is relieved with periodic professional debridement.  Patient arrives at appointment with her daughter, Sheila Spears, who also has an appointment on today's visit.  Patient does not monitor blood glucose daily.  Patient is not required to monitor blood glucose daily.  New problem(s): None.   PCP is Georgann Housekeeper, MD , and last visit was June 24, 2022.  Allergies  Allergen Reactions   Hydrochlorothiazide     Other reaction(s): leg cramps   Cephalexin Rash    Other reaction(s): Unknown    Review of Systems: Negative except as noted in the HPI.  Objective: No changes noted in today's physical examination.  Vascular Examination: Palpable pedal pulses b/l LE. No pedal edema b/l. Skin temperature gradient WNL b/l. No varicosities b/l. Pedal hair sparse. No cyanosis or clubbing noted b/l LE.Marland Kitchen  Dermatological Examination: Pedal skin with normal turgor, texture and tone b/l. No open wounds. No interdigital macerations b/l. Toenails 1-5 b/l thickened, discolored, dystrophic with subungual debris. There is pain on palpation to dorsal aspect of nailplates. No hyperkeratotic nor porokeratotic lesions present on today's visit.Marland Kitchen  Neurological Examination: Protective sensation intact with 10 gram monofilament b/l LE. Vibratory sensation intact b/l LE.   Musculoskeletal Examination: Muscle strength 5/5 to all LE muscle groups b/l. No pain, crepitus or joint limitation noted with ROM bilateral LE. No gross bony deformities bilaterally. Patient ambulates independent of any assistive aids.  Assessment/Plan: 1. Pain due to onychomycosis of toenails of both feet   -Consent given for treatment as described below: -Examined patient. -No new findings. No new  orders. -Mycotic toenails 1-5 bilaterally were debrided in length and girth with sterile nail nippers and dremel without incident. -Patient/POA to call should there be question/concern in the interim.   Return in about 3 months (around 10/08/2022).  Freddie Breech, DPM

## 2022-10-21 ENCOUNTER — Ambulatory Visit: Payer: Medicare Other | Admitting: Podiatry

## 2022-10-21 ENCOUNTER — Encounter: Payer: Self-pay | Admitting: Podiatry

## 2022-10-21 DIAGNOSIS — M79675 Pain in left toe(s): Secondary | ICD-10-CM

## 2022-10-21 DIAGNOSIS — B351 Tinea unguium: Secondary | ICD-10-CM | POA: Diagnosis not present

## 2022-10-21 DIAGNOSIS — M79674 Pain in right toe(s): Secondary | ICD-10-CM

## 2022-10-21 NOTE — Progress Notes (Signed)
  Subjective:  Patient ID: Sheila Spears, female    DOB: 1922-11-13,  MRN: [id number]  Sheila Spears presents to clinic today for painful thick toenails that are difficult to trim. Pain interferes with ambulation. Aggravating factors include wearing enclosed shoe gear. Pain is relieved with periodic professional debridement.  Chief Complaint  Patient presents with   Nail Problem    Routine foot care PCP-Karrar Husain PCP VST-6 Months ago   New problem(s): None.   PCP is Georgann Housekeeper, MD.  Allergies  Allergen Reactions   Hydrochlorothiazide     Other reaction(s): leg cramps   Cephalexin Rash    Other reaction(s): Unknown    Review of Systems: Negative except as noted in the HPI.  Objective: No changes noted in today's physical examination.  Sheila Spears is a pleasant 88 y.o. female WD, WN in NAD. AAO x 3.  Vascular Examination: Palpable pedal pulses b/l LE. No pedal edema b/l. Skin temperature gradient WNL b/l. No varicosities b/l. Pedal hair sparse. No cyanosis or clubbing noted b/l LE.Marland Kitchen  Dermatological Examination: Pedal skin with normal turgor, texture and tone b/l. No open wounds. No interdigital macerations b/l. Toenails 1-5 b/l thickened, discolored, dystrophic with subungual debris. There is pain on palpation to dorsal aspect of nailplates. No hyperkeratotic nor porokeratotic lesions present on today's visit.Marland Kitchen  Neurological Examination: Protective sensation intact with 10 gram monofilament b/l LE. Vibratory sensation intact b/l LE.   Musculoskeletal Examination: Muscle strength 5/5 to all LE muscle groups b/l. No pain, crepitus or joint limitation noted with ROM bilateral LE. No gross bony deformities bilaterally. Patient ambulates independent of any assistive aids.  Assessment/Plan: 1. Pain due to onychomycosis of toenails of both feet     No orders of the defined types were placed in this encounter.   -Consent given for treatment as described  below: -Continue supportive shoe gear daily. -Toenails 1-5 b/l were debrided in length and girth with sterile nail nippers and dremel without iatrogenic bleeding.  -Patient/POA to call should there be question/concern in the interim.   Return in about 3 months (around 01/21/2023).  Freddie Breech, DPM

## 2022-11-11 DIAGNOSIS — H0102A Squamous blepharitis right eye, upper and lower eyelids: Secondary | ICD-10-CM | POA: Diagnosis not present

## 2022-11-11 DIAGNOSIS — H0102B Squamous blepharitis left eye, upper and lower eyelids: Secondary | ICD-10-CM | POA: Diagnosis not present

## 2022-11-11 DIAGNOSIS — H04123 Dry eye syndrome of bilateral lacrimal glands: Secondary | ICD-10-CM | POA: Diagnosis not present

## 2022-11-11 DIAGNOSIS — H401123 Primary open-angle glaucoma, left eye, severe stage: Secondary | ICD-10-CM | POA: Diagnosis not present

## 2022-11-26 DIAGNOSIS — I10 Essential (primary) hypertension: Secondary | ICD-10-CM | POA: Diagnosis not present

## 2022-11-26 DIAGNOSIS — R7303 Prediabetes: Secondary | ICD-10-CM | POA: Diagnosis not present

## 2022-11-26 DIAGNOSIS — E46 Unspecified protein-calorie malnutrition: Secondary | ICD-10-CM | POA: Diagnosis not present

## 2022-11-26 DIAGNOSIS — M1990 Unspecified osteoarthritis, unspecified site: Secondary | ICD-10-CM | POA: Diagnosis not present

## 2023-01-01 DIAGNOSIS — L812 Freckles: Secondary | ICD-10-CM | POA: Diagnosis not present

## 2023-02-02 DIAGNOSIS — Z1231 Encounter for screening mammogram for malignant neoplasm of breast: Secondary | ICD-10-CM | POA: Diagnosis not present

## 2023-02-10 ENCOUNTER — Ambulatory Visit: Payer: Medicare Other | Admitting: Podiatry

## 2023-02-17 ENCOUNTER — Ambulatory Visit (INDEPENDENT_AMBULATORY_CARE_PROVIDER_SITE_OTHER): Payer: Medicare Other | Admitting: Podiatrist

## 2023-02-17 ENCOUNTER — Encounter: Payer: Self-pay | Admitting: Podiatrist

## 2023-02-17 DIAGNOSIS — M79675 Pain in left toe(s): Secondary | ICD-10-CM | POA: Diagnosis not present

## 2023-02-17 DIAGNOSIS — B351 Tinea unguium: Secondary | ICD-10-CM

## 2023-02-17 DIAGNOSIS — M79674 Pain in right toe(s): Secondary | ICD-10-CM

## 2023-02-17 NOTE — Progress Notes (Signed)
  Subjective:  Patient ID: Sheila Spears, female    DOB: [birth year],  MRN: [id number]  Sheila Spears presents to clinic today for painful thick toenails that are difficult to trim. Pain interferes with ambulation. Aggravating factors include wearing enclosed shoe gear. Pain is relieved with periodic professional debridement.  Chief Complaint  Patient presents with   Nail Problem    RFC PCP-Husain PCP VST-3 months ago   New problem(s): None.   PCP is Wenda Low, MD.  Allergies  Allergen Reactions   Hydrochlorothiazide     Other reaction(s): leg cramps   Cephalexin Rash    Other reaction(s): Unknown    Review of Systems: Negative except as noted in the HPI.  Objective: No changes noted in today's physical examination.  Sheila Spears is a pleasant 89 y.o. female WD, WN in NAD. AAO x 3.  Vascular Examination: Palpable pedal pulses b/l LE. No pedal edema b/l. Skin temperature gradient WNL b/l. No varicosities b/l. Pedal hair sparse. No cyanosis or clubbing noted b/l LE.Marland Kitchen  Dermatological Examination: Pedal skin with normal turgor, texture and tone b/l. No open wounds. No interdigital macerations b/l. Toenails 1-5 b/l thickened, discolored, dystrophic with subungual debris. There is pain on palpation to dorsal aspect of nailplates. No hyperkeratotic nor porokeratotic lesions present on today's visit.Marland Kitchen  Neurological Examination: Protective sensation intact with 10 gram monofilament b/l LE. Vibratory sensation intact b/l LE.   Musculoskeletal Examination: Muscle strength 5/5 to all LE muscle groups b/l. No pain, crepitus or joint limitation noted with ROM bilateral LE. No gross bony deformities bilaterally. Patient ambulates independent of any assistive aids.  Assessment/Plan:   ICD-10-CM   1. Pain due to onychomycosis of toenails of both feet  B35.1    M79.675    M79.674        -Consent given for treatment as described below: -Continue supportive shoe gear  daily. -Toenails 1-5 b/l were debrided in length and girth with sterile nail nippers and dremel without iatrogenic bleeding.  -Patient/POA to call should there be question/concern in the interim.     Bronson Ing, DPM

## 2023-03-11 DIAGNOSIS — H401123 Primary open-angle glaucoma, left eye, severe stage: Secondary | ICD-10-CM | POA: Diagnosis not present

## 2023-04-01 DIAGNOSIS — E46 Unspecified protein-calorie malnutrition: Secondary | ICD-10-CM | POA: Diagnosis not present

## 2023-04-01 DIAGNOSIS — I10 Essential (primary) hypertension: Secondary | ICD-10-CM | POA: Diagnosis not present

## 2023-04-01 DIAGNOSIS — R252 Cramp and spasm: Secondary | ICD-10-CM | POA: Diagnosis not present

## 2023-04-01 DIAGNOSIS — R7303 Prediabetes: Secondary | ICD-10-CM | POA: Diagnosis not present

## 2023-04-16 ENCOUNTER — Encounter: Payer: Self-pay | Admitting: Speech Pathology

## 2023-04-16 ENCOUNTER — Other Ambulatory Visit: Payer: Self-pay

## 2023-04-16 ENCOUNTER — Ambulatory Visit: Payer: Medicare Other | Attending: Physician Assistant | Admitting: Speech Pathology

## 2023-04-16 DIAGNOSIS — R1310 Dysphagia, unspecified: Secondary | ICD-10-CM | POA: Insufficient documentation

## 2023-04-16 NOTE — Therapy (Signed)
OUTPATIENT SPEECH LANGUAGE PATHOLOGY SWALLOW EVALUATION   Patient Name: Sheila Spears MRN: [id number] DOB:1922/04/05, 89 y.o., female Today's Date: 04/17/2023  PCP: Georgann Housekeeper, MD REFERRING PROVIDER: Michiel Cowboy., PA  END OF SESSION:  End of Session - 04/17/23 0805     Visit Number 1    Number of Visits 1    SLP Start Time 1445    SLP Stop Time  1530    SLP Time Calculation (min) 45 min    Activity Tolerance Patient tolerated treatment well             Past Medical History:  Diagnosis Date   Dry eyes    Glaucoma    Hypertension    Ptosis of left eyelid    Past Surgical History:  Procedure Laterality Date   ABDOMINAL HYSTERECTOMY     CATARACT EXTRACTION Bilateral    TRIGGER FINGER RELEASE Right 01/15/2017   Procedure: RELEASE TRIGGER FINGER/A-1 PULLEY RIGHT RING FINGER;  Surgeon: Betha Loa, MD;  Location: [facility];  Service: Orthopedics;  Laterality: Right;   Patient Active Problem List   Diagnosis Date Noted   Cough 04/02/2021   Glaucoma 04/02/2021   Osteoarthritis 04/02/2021   Prediabetes 04/02/2021   Protein calorie malnutrition (HCC) 04/02/2021   De Quervain's tenosynovitis 11/14/2020   Ptosis of left eyelid 06/12/2020   Low back pain 07/07/2019   Pain in left foot 02/03/2019   Trigger middle finger of left hand 01/12/2019   Closed sternal manubrial dissociation fracture with routine healing 12/21/2017   Impaired fasting glucose 04/14/2017   Microscopic hematuria 04/14/2017   Neutropenia (HCC) 04/14/2017   Osteopenia 04/14/2017   Pain in finger of right hand 02/06/2017   Onychomycosis 06/20/2015   Pain in lower limb 06/20/2015   Cellulitis of right upper extremity    Cellulitis 05/08/2015   Essential hypertension 05/08/2015   Macular erythematous rash, nonblanching 05/08/2015    ONSET DATE: 04/07/23   REFERRING DIAG: R13.10 (ICD-10-CM) - Dysphagia, unspecified   THERAPY DIAG:  Dysphagia, unspecified  type  Rationale for Evaluation and Treatment: Rehabilitation  SUBJECTIVE:   SUBJECTIVE STATEMENT: "Minor inconvenience"-regarding swallowing   Pt accompanied by: self  PERTINENT HISTORY: Concerns with weight loss, she has been steadily losing weight for the last few years. Over the last 4 months, she did try Ensure but found that this upsets her stomach. She endorses little appetite and caregiver states that portions tend to be smaller than normal. Caregiver also states swallowing difficulty, and noted that the patient coughs and gets short of breath when eating. This happens with both solid and liquid foods. The patient has not brought up this concern. Memory concerns, some difficulty with short term memory, specifically times, dates, and appointments. She is not requiring help with ADLs or finances yet.   PAIN:  Are you having pain? No  FALLS: Has patient fallen in last 6 months?  No  LIVING ENVIRONMENT: Lives with: lives with their family Lives in: House/apartment  PLOF:  Level of assistance: Independent with ADLs Employment: Retired  PATIENT GOALS: To increase weight.   OBJECTIVE:   DIAGNOSTIC FINDINGS: Concerns with weight loss, she has been steadily losing weight for the last few years. Over the last 4 months, she did try Ensure but found that this upsets her stomach. She endorses little appetite and caregiver states that portions tend to be smaller than normal. Caregiver also states swallowing difficulty, and noted that the patient coughs and gets short of breath when eating.  This happens with both solid and liquid foods. The patient has not brought up this concern. Memory concerns, some difficulty with short term memory, specifically times, dates, and appointments. She is not requiring help with ADLs or finances yet.     ORAL MOTOR EXAMINATION: Overall status: WFL Comments: Slight weakness on right side during smile trial.   CLINICAL SWALLOW ASSESSMENT:   Current diet:  regular Dentition: adequate natural dentition Patient directly observed with POs: Yes: regular, thin liquids, and mixed consistencies  Feeding: able to feed self Liquids provided by: cup Oral phase signs and symptoms: prolonged mastication Pharyngeal phase signs and symptoms:  delayed occasional cough.  Comments: Yale swallow protocol administered without overt sign or symptom of aspiration. Across PO trials patient denies sensation of residue or pain, vocal quality remains consistent.   TODAY'S TREATMENT:                                                                                                                                         DATE:   04/16/23: Education on role of SLP for swallowing. Recommendations of taking time while eating, increasing water intake, and finding ways to increase calorie intake to make up for nutritional deficits. Provided education of results of clinical swallow exam and SLP recommendation. Education on MBSS, patient declined to have swallow study at this time.   PATIENT EDUCATION: Education details: See above  Person educated: Patient Education method: Explanation Education comprehension: verbalized understanding   ASSESSMENT:  CLINICAL IMPRESSION: Patient is a 89 y.o. F who was seen today for dysphagia evaluation, she passed the Yale swallow protocol, and oral mech exam showed within functional limits. Per patient report, patient tends to rush while eating which she attributes to episodes of coughing. Tells SLP coughing happens occasionally. She is not avoiding any consistencies, denies recent respiratory infections, remote history per patient report of reflux. Education provided regarding aspiration and management of aspiration risk factors. MBSS offered to accurately assess swallow function. Patient denies at this time, but will follow up with PCP if swallow problems worsen. Education provided with swallow compensation and strategies to maximize safety and  efficacy. Patient verbalizes understanding and denies need for follow up with SLP.     [place], Student-SLP 04/17/2023, 8:05 AM

## 2023-04-16 NOTE — Patient Instructions (Signed)
Take your time eating and drinking  Make sure your planning enough time to eat, because you don't want to lose any more weight  Brush your teeth twice a day   Stay active   Stay healthy  Coughing MAY mean that food or liquid is going the wrong way, and coughing is your body's natural defense mechanism  If this problem gets worse, let your doctor know you need a swallow study  We aren't doing a swallow study today because you did great during our exam and you haven't had any respiratory concerns

## 2023-04-17 ENCOUNTER — Other Ambulatory Visit: Payer: Self-pay

## 2023-04-28 DIAGNOSIS — R413 Other amnesia: Secondary | ICD-10-CM | POA: Diagnosis not present

## 2023-04-28 DIAGNOSIS — D696 Thrombocytopenia, unspecified: Secondary | ICD-10-CM | POA: Diagnosis not present

## 2023-04-28 DIAGNOSIS — R54 Age-related physical debility: Secondary | ICD-10-CM | POA: Diagnosis not present

## 2023-06-30 DIAGNOSIS — R7303 Prediabetes: Secondary | ICD-10-CM | POA: Diagnosis not present

## 2023-06-30 DIAGNOSIS — D696 Thrombocytopenia, unspecified: Secondary | ICD-10-CM | POA: Diagnosis not present

## 2023-06-30 DIAGNOSIS — M1990 Unspecified osteoarthritis, unspecified site: Secondary | ICD-10-CM | POA: Diagnosis not present

## 2023-06-30 DIAGNOSIS — Z0000 Encounter for general adult medical examination without abnormal findings: Secondary | ICD-10-CM | POA: Diagnosis not present

## 2023-06-30 DIAGNOSIS — Z1331 Encounter for screening for depression: Secondary | ICD-10-CM | POA: Diagnosis not present

## 2023-06-30 DIAGNOSIS — I10 Essential (primary) hypertension: Secondary | ICD-10-CM | POA: Diagnosis not present

## 2023-07-07 ENCOUNTER — Encounter: Payer: Self-pay | Admitting: Podiatry

## 2023-07-07 ENCOUNTER — Ambulatory Visit (INDEPENDENT_AMBULATORY_CARE_PROVIDER_SITE_OTHER): Payer: Medicare Other | Admitting: Podiatry

## 2023-07-07 DIAGNOSIS — B351 Tinea unguium: Secondary | ICD-10-CM

## 2023-07-07 DIAGNOSIS — M79675 Pain in left toe(s): Secondary | ICD-10-CM | POA: Diagnosis not present

## 2023-07-07 DIAGNOSIS — M79674 Pain in right toe(s): Secondary | ICD-10-CM

## 2023-07-07 NOTE — Progress Notes (Signed)
  Subjective:  Patient ID: Sheila Spears, female    DOB: Mar 12, 1922,  MRN: [id number]  Sheila Spears presents to clinic today for: painful elongated mycotic toenails 1-5 bilaterally which are tender when wearing enclosed shoe gear. Pain is relieved with periodic professional debridement.  Chief Complaint  Patient presents with   RFC    RFC    PCP is Georgann Housekeeper, MD.  Allergies  Allergen Reactions   Hydrochlorothiazide     Other reaction(s): leg cramps   Cephalexin Rash    Other reaction(s): Unknown    Review of Systems: Negative except as noted in the HPI.  Objective: No changes noted in today's physical examination. There were no vitals filed for this visit.  Sheila Spears is a pleasant 89 y.o. female in NAD. AAO x 3.  Vascular Examination: Capillary refill time <3 seconds b/l LE. Palpable pedal pulses b/l LE. Digital hair decreased b/l. No pedal edema b/l. Skin temperature gradient WNL b/l. No varicosities b/l. Marland Kitchen  Dermatological Examination: Pedal skin with normal turgor, texture and tone b/l. No open wounds. No interdigital macerations b/l. Toenails 1-5 b/l thickened, discolored, dystrophic with subungual debris. There is pain on palpation to dorsal aspect of nailplates. No corns, calluses nor porokeratotic lesions noted..  Neurological Examination: Protective sensation intact with 10 gram monofilament b/l LE. Vibratory sensation intact b/l LE.   Musculoskeletal Examination: Muscle strength 5/5 to all lower extremity muscle groups bilaterally. No pain, crepitus or joint limitation noted with ROM bilateral LE. No gross bony deformities bilaterally.  Assessment/Plan: 1. Pain due to onychomycosis of toenails of both feet     -Patient was evaluated and treated. All patient's and/or POA's questions/concerns answered on today's visit. -Patient to continue soft, supportive shoe gear daily. -Mycotic toenails 1-5 bilaterally were debrided in length and girth with  sterile nail nippers and dremel without incident. -Patient/POA to call should there be question/concern in the interim.   Return in about 3 months (around 10/07/2023).  Freddie Breech, DPM

## 2023-07-09 DIAGNOSIS — H401123 Primary open-angle glaucoma, left eye, severe stage: Secondary | ICD-10-CM | POA: Diagnosis not present

## 2023-08-18 DIAGNOSIS — Z23 Encounter for immunization: Secondary | ICD-10-CM | POA: Diagnosis not present

## 2023-10-12 ENCOUNTER — Encounter: Payer: Medicare Other | Admitting: Podiatry

## 2023-10-12 NOTE — Progress Notes (Signed)
Patient was a no-show for today's scheduled appointment.

## 2023-11-05 DIAGNOSIS — H04123 Dry eye syndrome of bilateral lacrimal glands: Secondary | ICD-10-CM | POA: Diagnosis not present

## 2023-11-05 DIAGNOSIS — H0102A Squamous blepharitis right eye, upper and lower eyelids: Secondary | ICD-10-CM | POA: Diagnosis not present

## 2023-11-05 DIAGNOSIS — H0102B Squamous blepharitis left eye, upper and lower eyelids: Secondary | ICD-10-CM | POA: Diagnosis not present

## 2023-11-05 DIAGNOSIS — H401123 Primary open-angle glaucoma, left eye, severe stage: Secondary | ICD-10-CM | POA: Diagnosis not present

## 2023-12-09 ENCOUNTER — Ambulatory Visit (INDEPENDENT_AMBULATORY_CARE_PROVIDER_SITE_OTHER): Payer: 59

## 2023-12-09 ENCOUNTER — Ambulatory Visit
Admission: RE | Admit: 2023-12-09 | Discharge: 2023-12-09 | Disposition: A | Discharge status: Home / Self Care | Payer: 59 | Source: Ambulatory Visit | Attending: Physician Assistant | Admitting: Physician Assistant

## 2023-12-09 VITALS — BP 153/87 | HR 56 | Temp 97.6°F | Resp 16

## 2023-12-09 DIAGNOSIS — R051 Acute cough: Secondary | ICD-10-CM

## 2023-12-09 DIAGNOSIS — R062 Wheezing: Secondary | ICD-10-CM

## 2023-12-09 DIAGNOSIS — J329 Chronic sinusitis, unspecified: Secondary | ICD-10-CM

## 2023-12-09 DIAGNOSIS — J40 Bronchitis, not specified as acute or chronic: Secondary | ICD-10-CM

## 2023-12-09 MED ORDER — BENZONATATE 100 MG PO CAPS: 100.0000 mg | ORAL_CAPSULE | Freq: Three times a day (TID) | ORAL | 0 refills | Status: AC

## 2023-12-09 MED ORDER — AEROCHAMBER PLUS FLO-VU LARGE MISC
1.0000 | Freq: Once | Status: AC
  Administered 2023-12-09: 1

## 2023-12-09 MED ORDER — DOXYCYCLINE HYCLATE 100 MG PO CAPS
100.0000 mg | ORAL_CAPSULE | Freq: Two times a day (BID) | ORAL | 0 refills | Status: AC
Start: 2023-12-09 — End: ?

## 2023-12-09 MED ORDER — ALBUTEROL SULFATE HFA 108 (90 BASE) MCG/ACT IN AERS
2.0000 | INHALATION_SPRAY | Freq: Once | RESPIRATORY_TRACT | Status: AC
  Administered 2023-12-09: 2 via RESPIRATORY_TRACT

## 2023-12-09 NOTE — Discharge Instructions (Signed)
 I did not see any evidence of pneumonia on your chest x-ray.  If the radiologist sees something I will call you with additional instructions.  Start doxycycline  twice daily for 10 days.  Stay out of the sun while on this medication.  Use albuterol  every 4-6 hours as needed.  Use Tessalon  for cough.  Continue over-the-counter medications including Mucinex, Flonase, Tylenol .  If your symptoms are not improving within a week or if anything worsens and you have fever, worsening cough, shortness of breath, chest pain, nausea/vomiting you need to go to the emergency room.  Follow-up with your primary care first thing next week.

## 2023-12-09 NOTE — ED Triage Notes (Signed)
 Patient states that she has had a cold for several days and it has left her with a cough.  Patient has tried Robitussin.

## 2023-12-09 NOTE — ED Provider Notes (Signed)
 [facility]    CSN: [id number] Arrival date & time: 12/09/23  1135      History   Chief Complaint Chief Complaint  Patient presents with   Cough    Entered by patient    HPI MARRIETTA Spears is a 89 y.o. female.   Patient presents today with the week plus long history of URI symptoms including congestion and sinus pressure.  She is also developed a significant cough that has been persistent prompting evaluation.  Denies any fever, chest pain, shortness of breath, nausea, vomiting.  She has been taking Robitussin without improvement of symptoms.  Denies any recent antibiotics or steroids.  Denies any known sick contacts.  She has not had COVID that she is aware of.  She is eating and drinking normally.  Denies any history of asthma, COPD, current smoking.  Reports she is a former smoker but quit many years ago.    Past Medical History:  Diagnosis Date   Dry eyes    Glaucoma    Hypertension    Ptosis of left eyelid     Patient Active Problem List   Diagnosis Date Noted   Cough 04/02/2021   Glaucoma 04/02/2021   Osteoarthritis 04/02/2021   Prediabetes 04/02/2021   Protein calorie malnutrition (HCC) 04/02/2021   De Quervain's tenosynovitis 11/14/2020   Ptosis of left eyelid 06/12/2020   Low back pain 07/07/2019   Pain in left foot 02/03/2019   Trigger middle finger of left hand 01/12/2019   Closed sternal manubrial dissociation fracture with routine healing 12/21/2017   Impaired fasting glucose 04/14/2017   Microscopic hematuria 04/14/2017   Neutropenia (HCC) 04/14/2017   Osteopenia 04/14/2017   Pain in finger of right hand 02/06/2017   Onychomycosis 06/20/2015   Pain in lower limb 06/20/2015   Cellulitis of right upper extremity    Cellulitis 05/08/2015   Essential hypertension 05/08/2015   Macular erythematous rash, nonblanching 05/08/2015    Past Surgical History:  Procedure Laterality Date   ABDOMINAL HYSTERECTOMY     CATARACT EXTRACTION  Bilateral    TRIGGER FINGER RELEASE Right 01/15/2017   Procedure: RELEASE TRIGGER FINGER/A-1 PULLEY RIGHT RING FINGER;  Surgeon: Brunilda Capra, MD;  Location: [facility];  Service: Orthopedics;  Laterality: Right;    OB History   No obstetric history on file.      Home Medications    Prior to Admission medications   Medication Sig Start Date End Date Taking? Authorizing Provider  amLODipine (NORVASC) 2.5 MG tablet Take 2.5 mg by mouth at bedtime.  08/03/20  Yes [provider]  amLODipine (NORVASC) 2.5 MG tablet 1 tablet   Yes [provider]  benzonatate  (TESSALON ) 100 MG capsule Take 1 capsule (100 mg total) by mouth every 8 (eight) hours. 12/09/23  Yes Culver Feighner K, PA-C  betamethasone  acetate-betamethasone  sodium phosphate (CELESTONE ) 6 (3-3) MG/ML injection Inject into the articular space. 08/06/21  Yes [provider]  brimonidine (ALPHAGAN) 0.2 % ophthalmic solution Place 1 drop into the left eye 2 (two) times daily. 05/09/21  Yes [provider]  Calcium Citrate (CITRACAL PO) Take 1 tablet by mouth daily.   Yes [provider]  chlorhexidine  (PERIDEX ) 0.12 % solution SMARTSIG:By Mouth 06/13/22  Yes [provider]  DILT-XR 180 MG 24 hr capsule Take 180 mg by mouth daily. 03/11/21  Yes [provider]  DILTIAZEM  HCL PO 1 capsule   Yes [provider]  DORZOLAMIDE HCL OP 1 drop into affected  eye   Yes [provider]  dorzolamide-timolol (COSOPT) 22.3-6.8 MG/ML ophthalmic solution Place 1 drop into the left eye 2 (two) times daily. 10/06/17  Yes [provider]  doxycycline  (VIBRAMYCIN ) 100 MG capsule Take 1 capsule (100 mg total) by mouth 2 (two) times daily. 12/09/23  Yes Romario Tith K, PA-C  erythromycin ophthalmic ointment APPLY A SMALL AMOUNT ON EYELID AT BEDTIME 07/03/20  Yes [provider]  ferrous sulfate  325 (65 FE) MG tablet 1 tablet   Yes [provider]   ibuprofen (ADVIL) 400 MG tablet Take 400 mg by mouth every 4 (four) hours as needed. 06/13/22  Yes [provider]  latanoprost (XALATAN) 0.005 % ophthalmic solution 1 drop into affected eye in the evening   Yes [provider]  Lidocaine  (SALONPAS PAIN RELIEVING) 4 % PTCH Apply 1 each topically every 12 (twelve) hours. 09/24/21  Yes Kommor, Madison, MD  lidocaine  (XYLOCAINE ) 1 % (with preservative) injection by Infiltration route. 12/26/20  Yes [provider]  lidocaine  (XYLOCAINE ) 1 % (with preservative) injection by Infiltration route. 08/06/21  Yes [provider]  Multiple Vitamin (MULTIVITAMINS PO) 1 capsule   Yes [provider]  naproxen  (NAPROSYN ) 500 MG tablet Take 1 tablet (500 mg total) by mouth 2 (two) times daily. 09/24/21  Yes Kommor, Madison, MD  nitrofurantoin, macrocrystal-monohydrate, (MACROBID) 100 MG capsule Take by mouth. 04/18/21  Yes [provider]  Omega-3 Fatty Acids (FISH OIL) 1000 MG CAPS 2 capsules   Yes [provider]  TIADYLT  ER 180 MG 24 hr capsule  05/01/19  Yes [provider]  traMADol  (ULTRAM ) 50 MG tablet    Yes [provider]  valsartan (DIOVAN) 320 MG tablet 1 tablet   Yes [provider]  Vitamins A & D (VITAMIN A & D) 25000-1000 units TABS 1 capsule   Yes [provider]  VYZULTA 0.024 % SOLN Apply 1 drop to eye at bedtime. 05/09/21  Yes [provider]    Family History Family History  Problem Relation Age of Onset   Cancer - Colon Mother    Other Father        unknown history    Social History Social History   Tobacco Use   Smoking status: Former    Types: Cigarettes   Smokeless tobacco: Never   Tobacco comments:    quit 40+ years ago  Vaping Use   Vaping status: Never Used  Substance Use Topics   Alcohol use: No    Alcohol/week: 0.0 standard drinks of alcohol   Drug use: Never     Allergies   Hydrochlorothiazide and  Cephalexin    Review of Systems Review of Systems  Constitutional:  Positive for activity change. Negative for appetite change, fatigue and fever.  HENT:  Positive for congestion and sinus pressure. Negative for sneezing and sore throat.   Respiratory:  Positive for cough. Negative for shortness of breath.   Cardiovascular:  Negative for chest pain.  Gastrointestinal:  Negative for abdominal pain, diarrhea, nausea and vomiting.     Physical Exam Triage Vital Signs ED Triage Vitals  Encounter Vitals Group     BP 12/09/23 1201 (!) 153/87     Systolic BP Percentile --      Diastolic BP Percentile --      Pulse Rate 12/09/23 1201 (!) 56     Resp 12/09/23 1201 16     Temp 12/09/23 1201 97.6 F (36.4 C)     Temp Source  12/09/23 1201 Oral     SpO2 12/09/23 1201 98 %     Weight --      Height --      Head Circumference --      Peak Flow --      Pain Score 12/09/23 1203 0     Pain Loc --      Pain Education --      Exclude from Growth Chart --    No data found.  Updated Vital Signs BP (!) 153/87 (BP Location: Left Arm)   Pulse (!) 56   Temp 97.6 F (36.4 C) (Oral)   Resp 16   SpO2 98%   Visual Acuity Right Eye Distance:   Left Eye Distance:   Bilateral Distance:    Right Eye Near:   Left Eye Near:    Bilateral Near:     Physical Exam Vitals reviewed.  Constitutional:      General: She is awake. She is not in acute distress.    Appearance: Normal appearance. She is well-developed. She is not ill-appearing.     Comments: Very pleasant female appears stated age in no acute distress sitting comfortably in exam room  HENT:     Head: Normocephalic and atraumatic.     Right Ear: Tympanic membrane, ear canal and external ear normal. Tympanic membrane is not erythematous or bulging.     Left Ear: Tympanic membrane, ear canal and external ear normal. Tympanic membrane is not erythematous or bulging.     Nose:     Right Sinus: Maxillary sinus tenderness present. No frontal  sinus tenderness.     Left Sinus: Maxillary sinus tenderness present. No frontal sinus tenderness.     Mouth/Throat:     Pharynx: Uvula midline. No oropharyngeal exudate or posterior oropharyngeal erythema.  Cardiovascular:     Rate and Rhythm: Normal rate and regular rhythm.     Heart sounds: Normal heart sounds, S1 normal and S2 normal. No murmur heard. Pulmonary:     Effort: Pulmonary effort is normal.     Breath sounds: Normal breath sounds. No wheezing, rhonchi or rales.     Comments: Widespread wheezing Psychiatric:        Behavior: Behavior is cooperative.      UC Treatments / Results  Labs (all labs ordered are listed, but only abnormal results are displayed) Labs Reviewed - No data to display  EKG   Radiology No results found.  Procedures Procedures (including critical care time)  Medications Ordered in UC Medications  albuterol  (VENTOLIN  HFA) 108 (90 Base) MCG/ACT inhaler 2 puff (2 puffs Inhalation Given 12/09/23 1244)  AeroChamber Plus Flo-Vu Large MISC 1 each (1 each Other Given 12/09/23 1245)    Initial Impression / Assessment and Plan / UC Course  I have reviewed the triage vital signs and the nursing notes.  Pertinent labs & imaging results that were available during my care of the patient were reviewed by me and considered in my medical decision making (see chart for details).     Patient is well-appearing, afebrile, nontoxic, nontachycardic, with oxygen saturation of 98%.  She did have wheezing but this improved following a dose of albuterol  in clinic.  Chest x-ray was obtained that did not show any evidence of focal consolidation based on my primary read.  At the time of discharge we were waiting for radiologist over read and we will contact her if we need to change treatment plan based on these findings.  Given prolonged and  worsening symptoms will cover for sinobronchitis with doxycycline  100 mg twice daily for 10 days.  Discussed that she is to avoid  prolonged sun exposure while on this medication.  She can continue using albuterol  every 4-6 hours as needed.  She was started on Tessalon  to help with cough.  Recommended over-the-counter medication including Mucinex, Flonase, Tylenol .  She is to rest and drink plenty of fluid.  Recommend she follow-up with her primary care first thing next week.  Discussed that if anything worsens and she has worsening cough, fever, chest pain, shortness of breath, nausea/vomiting she needs to be seen immediately.  Final Clinical Impressions(s) / UC Diagnoses   Final diagnoses:  Acute cough  Wheezing  Sinobronchitis     Discharge Instructions      I did not see any evidence of pneumonia on your chest x-ray.  If the radiologist sees something I will call you with additional instructions.  Start doxycycline  twice daily for 10 days.  Stay out of the sun while on this medication.  Use albuterol  every 4-6 hours as needed.  Use Tessalon  for cough.  Continue over-the-counter medications including Mucinex, Flonase, Tylenol .  If your symptoms are not improving within a week or if anything worsens and you have fever, worsening cough, shortness of breath, chest pain, nausea/vomiting you need to go to the emergency room.  Follow-up with your primary care first thing next week.     ED Prescriptions     Medication Sig Dispense Auth. Provider   doxycycline  (VIBRAMYCIN ) 100 MG capsule Take 1 capsule (100 mg total) by mouth 2 (two) times daily. 20 capsule Rashon Westrup K, PA-C   benzonatate  (TESSALON ) 100 MG capsule Take 1 capsule (100 mg total) by mouth every 8 (eight) hours. 21 capsule Yacine Droz K, PA-C      PDMP not reviewed this encounter.   Budd Cargo, PA-C 12/09/23 1317

## 2023-12-31 DIAGNOSIS — D696 Thrombocytopenia, unspecified: Secondary | ICD-10-CM | POA: Diagnosis not present

## 2023-12-31 DIAGNOSIS — I10 Essential (primary) hypertension: Secondary | ICD-10-CM | POA: Diagnosis not present

## 2023-12-31 DIAGNOSIS — Z23 Encounter for immunization: Secondary | ICD-10-CM | POA: Diagnosis not present

## 2023-12-31 DIAGNOSIS — R7303 Prediabetes: Secondary | ICD-10-CM | POA: Diagnosis not present

## 2023-12-31 DIAGNOSIS — E46 Unspecified protein-calorie malnutrition: Secondary | ICD-10-CM | POA: Diagnosis not present

## 2024-01-12 ENCOUNTER — Ambulatory Visit (INDEPENDENT_AMBULATORY_CARE_PROVIDER_SITE_OTHER): Payer: Medicare Other | Admitting: Podiatry

## 2024-01-12 ENCOUNTER — Encounter: Payer: Self-pay | Admitting: Podiatry

## 2024-01-12 VITALS — Ht 63.0 in | Wt 123.0 lb

## 2024-01-12 DIAGNOSIS — M79674 Pain in right toe(s): Secondary | ICD-10-CM

## 2024-01-12 DIAGNOSIS — M79675 Pain in left toe(s): Secondary | ICD-10-CM | POA: Diagnosis not present

## 2024-01-12 DIAGNOSIS — B351 Tinea unguium: Secondary | ICD-10-CM | POA: Diagnosis not present

## 2024-01-18 NOTE — Progress Notes (Signed)
  Subjective:  Patient ID: Sheila Spears, female    DOB: 13-Nov-1922,  MRN: [id number]  89 y.o. female presents to clinic with  painful, elongated thickened toenails x 10 which are symptomatic when wearing enclosed shoe gear. This interferes with his/her daily activities.  Chief Complaint  Patient presents with   RFC    She is here for a nail trim. PCP is Dr. Donette Larry and seen last week.      New problem(s): None   PCP is Georgann Housekeeper, MD.  Allergies  Allergen Reactions   Cephalexin Rash    Other reaction(s): Unknown   Hydrochlorothiazide     Other reaction(s): leg cramps    Review of Systems: Negative except as noted in the HPI.   Objective:  Sheila Spears is a pleasant 89 y.o. female WD, WN in NAD. AAO x 3.  Vascular Examination: Vascular status intact b/l with palpable pedal pulses. CFT immediate b/l. No edema. No pain with calf compression b/l. Skin temperature gradient WNL b/l. Pedal hair sparse. No cyanosis or clubbing noted b/l LE.  Neurological Examination: Sensation grossly intact b/l with 10 gram monofilament. Vibratory sensation intact b/l.   Dermatological Examination: Pedal skin with normal turgor, texture and tone b/l. Toenails 1-5 b/l thick, discolored, elongated with subungual debris and pain on dorsal palpation. No hyperkeratotic lesions noted b/l.   Musculoskeletal Examination: Muscle strength 5/5 to b/l LE. No pain, crepitus or joint limitation noted with ROM bilateral LE. No gross bony deformities bilaterally.  Radiographs: None  Last A1c:       No data to display           Assessment:   1. Pain due to onychomycosis of toenails of both feet    Plan:  Patient was evaluated and treated. All patient's and/or POA's questions/concerns addressed on today's visit. Toenails 1-5 debrided in length and girth without incident. Continue soft, supportive shoe gear daily. Report any pedal injuries to medical professional. Call office if there are any  questions/concerns. -Patient/POA to call should there be question/concern in the interim.  Return in about 3 months (around 04/10/2024).  Freddie Breech, DPM      [place] LOCATION: [address], [place] [address]                   Office [phone]   [facility] LOCATION: [address] [place], [place] [address] Office [phone]

## 2024-02-18 DIAGNOSIS — H401123 Primary open-angle glaucoma, left eye, severe stage: Secondary | ICD-10-CM | POA: Diagnosis not present

## 2024-04-19 ENCOUNTER — Ambulatory Visit: Payer: Medicare Other | Admitting: Podiatry

## 2024-04-19 ENCOUNTER — Ambulatory Visit (INDEPENDENT_AMBULATORY_CARE_PROVIDER_SITE_OTHER): Payer: Medicare Other | Admitting: Podiatry

## 2024-04-19 DIAGNOSIS — M79675 Pain in left toe(s): Secondary | ICD-10-CM

## 2024-04-19 DIAGNOSIS — M79674 Pain in right toe(s): Secondary | ICD-10-CM | POA: Diagnosis not present

## 2024-04-19 DIAGNOSIS — B351 Tinea unguium: Secondary | ICD-10-CM

## 2024-04-19 NOTE — Progress Notes (Signed)
    Subjective:  Patient ID: Sheila Spears, female    DOB: 21-Sep-1922,  MRN: [id number]  Sheila Spears presents to clinic today for:  Chief Complaint  Patient presents with   rfc    Nail trim    Patient notes nails are thick, discolored, elongated and painful in shoegear when trying to ambulate.  A family member is with her today.  PCP is Jearldine Mina, MD.  Past Medical History:  Diagnosis Date   Dry eyes    Glaucoma    Hypertension    Ptosis of left eyelid    Past Surgical History:  Procedure Laterality Date   ABDOMINAL HYSTERECTOMY     CATARACT EXTRACTION Bilateral    TRIGGER FINGER RELEASE Right 01/15/2017   Procedure: RELEASE TRIGGER FINGER/A-1 PULLEY RIGHT RING FINGER;  Surgeon: Brunilda Capra, MD;  Location: [facility];  Service: Orthopedics;  Laterality: Right;   Allergies  Allergen Reactions   Cephalexin  Rash    Other reaction(s): Unknown   Hydrochlorothiazide     Other reaction(s): leg cramps    Review of Systems: Negative except as noted in the HPI.  Objective:  Sheila Spears is a pleasant 89 y.o. female in NAD. AAO x 3.  Vascular Examination: Capillary refill time is 3-5 seconds to toes bilateral.  Trace palpable pedal pulses b/l LE. Digital hair sparse b/l.  Skin temperature gradient WNL b/l.    Dermatological Examination: Pedal skin with decreased turgor, texture and tone b/l. No open wounds. No interdigital macerations b/l. Toenails x10 are 3mm thick, discolored, dystrophic with subungual debris. There is pain with compression of the nail plates.  They are elongated x10.  Very small hyperkeratotic lesions on the medial aspect of the second toe DIPJ bilateral.  No pain on palpation of the lesions  Assessment/Plan: 1. Pain due to onychomycosis of toenails of both feet    The mycotic toenails were sharply debrided x10 with sterile nail nippers and a power debriding burr to decrease bulk/thickness and length.    The tiny corns on the  medial aspect of the second toe DIPJ were sanded with the power bur today as a courtesy.  Return in about 3 months (around 07/20/2024) for RFC.   Joe Murders, DPM, FACFAS [facility]     [address] [place], [place] [address]                Office [phone]  Fax [phone]

## 2024-05-31 DIAGNOSIS — H04123 Dry eye syndrome of bilateral lacrimal glands: Secondary | ICD-10-CM | POA: Diagnosis not present

## 2024-05-31 DIAGNOSIS — H401123 Primary open-angle glaucoma, left eye, severe stage: Secondary | ICD-10-CM | POA: Diagnosis not present

## 2024-05-31 DIAGNOSIS — Z961 Presence of intraocular lens: Secondary | ICD-10-CM | POA: Diagnosis not present

## 2024-07-05 DIAGNOSIS — I10 Essential (primary) hypertension: Secondary | ICD-10-CM | POA: Diagnosis not present

## 2024-07-05 DIAGNOSIS — R7303 Prediabetes: Secondary | ICD-10-CM | POA: Diagnosis not present

## 2024-07-05 DIAGNOSIS — Z0000 Encounter for general adult medical examination without abnormal findings: Secondary | ICD-10-CM | POA: Diagnosis not present

## 2024-07-05 DIAGNOSIS — D696 Thrombocytopenia, unspecified: Secondary | ICD-10-CM | POA: Diagnosis not present

## 2024-07-05 DIAGNOSIS — R413 Other amnesia: Secondary | ICD-10-CM | POA: Diagnosis not present

## 2024-07-05 DIAGNOSIS — E46 Unspecified protein-calorie malnutrition: Secondary | ICD-10-CM | POA: Diagnosis not present

## 2024-07-05 DIAGNOSIS — Z1331 Encounter for screening for depression: Secondary | ICD-10-CM | POA: Diagnosis not present

## 2024-07-19 ENCOUNTER — Ambulatory Visit: Payer: Medicare Other | Admitting: Podiatry

## 2024-07-19 ENCOUNTER — Encounter: Payer: Self-pay | Admitting: Podiatry

## 2024-07-19 ENCOUNTER — Ambulatory Visit (INDEPENDENT_AMBULATORY_CARE_PROVIDER_SITE_OTHER): Admitting: Podiatry

## 2024-07-19 DIAGNOSIS — L84 Corns and callosities: Secondary | ICD-10-CM

## 2024-07-19 DIAGNOSIS — B351 Tinea unguium: Secondary | ICD-10-CM | POA: Diagnosis not present

## 2024-07-19 DIAGNOSIS — M79675 Pain in left toe(s): Secondary | ICD-10-CM | POA: Diagnosis not present

## 2024-07-19 DIAGNOSIS — I739 Peripheral vascular disease, unspecified: Secondary | ICD-10-CM | POA: Diagnosis not present

## 2024-07-19 DIAGNOSIS — M79674 Pain in right toe(s): Secondary | ICD-10-CM

## 2024-07-19 NOTE — Progress Notes (Unsigned)
    Subjective:  Patient ID: Sheila Spears, female    DOB: Apr 12, 1922,  MRN: [id number]  Sheila Spears presents to clinic today for:  Chief Complaint  Patient presents with   [facility]    RFC nail trim Bilateral callus 2nd toe medial DOB 09/26/1922 Not diabetic. No anti coag   Patient notes nails are thick and elongated, causing pain in shoe gear when ambulating.  She has a painful corn on the inside of the left second toe as well as the dorsal aspect of the right fifth toe.  PCP is Ransom Other, MD. last seen 07/05/2024  Past Medical History:  Diagnosis Date   Dry eyes    Glaucoma    Hypertension    Ptosis of left eyelid    Allergies  Allergen Reactions   Cephalexin  Rash    Other reaction(s): Unknown   Hydrochlorothiazide     Other reaction(s): leg cramps    Objective:  Sheila Spears is a pleasant 89 y.o. female in NAD. AAO x 3.  Vascular Examination: Patient has palpable DP pulse, absent PT pulse bilateral.  Delayed capillary refill bilateral toes.  Sparse digital hair bilateral.  Proximal to distal cooling WNL bilateral.    Dermatological Examination: Interspaces are clear with no open lesions noted bilateral.  Skin is shiny and atrophic bilateral.  Nails are 3-49mm thick, with yellowish/brown discoloration, subungual debris and distal onycholysis x10.  There is pain with compression of nails x10.  There are hyperkeratotic lesions noted on the dorsal lateral aspect of the right fifth toe PIPJ and the medial aspect of the left second toe PIPJ.SABRA  Patient qualifies for at-risk foot care because of PVD.  Assessment/Plan: 1. Pain due to onychomycosis of toenails of both feet   2. Corns   3. PVD (peripheral vascular disease) (HCC)     Mycotic nails x10 were sharply debrided with sterile nail nippers and power debriding burr to decrease bulk and length.  Hyperkeratotic lesions x 2 were shaved with #312 blade.  The locations are listed in the dermatological examination.  These  are both proximal to the toe DIPJ's.  Return in about 3 months (around 10/19/2024) for RFC (along with her daughter who already checked out).   Awanda CHARM Imperial, DPM, FACFAS [facility]     [address] [place], [place] [address]                Office [phone]  Fax [phone]

## 2024-10-25 ENCOUNTER — Ambulatory Visit: Admitting: Podiatry

## 2024-10-25 DIAGNOSIS — M79672 Pain in left foot: Secondary | ICD-10-CM

## 2024-10-25 DIAGNOSIS — B351 Tinea unguium: Secondary | ICD-10-CM

## 2024-10-25 DIAGNOSIS — M79671 Pain in right foot: Secondary | ICD-10-CM | POA: Diagnosis not present

## 2024-10-25 NOTE — Progress Notes (Signed)
 Patient presents for evaluation and treatment of tenderness and some redness around nails feet.  Tenderness around toes with walking and wearing shoes.  Physical exam:  General appearance: Alert, pleasant, and in no acute distress.  Vascular: Pedal pulses: DP 1/4 B/L, PT 0/4 B/L. mIld edema lower legs bilaterally.  Capillary refill time immediate bilaterally  Neurologic:  Dermatologic:  Nails thickened, disfigured, discolored 1-5 BL with subungual debris.  Redness and hypertrophic nail folds along nail folds bilaterally but no signs of drainage or infection.  No hair growth lower extremity with areas of hyperpigmentation bilaterally.  Skin thin and atrophic.  Musculoskeletal:     Diagnosis: 1. Painful onychomycotic nails 1 through 5 bilaterally. 2. Pain toes 1 through 5 bilaterally.  Plan: -Debrided onychomycotic nails 1 through 5 bilaterally.  Sharply debrided nails with nail clipper and reduced with a power bur.  Return 3 months RFC

## 2025-01-31 ENCOUNTER — Ambulatory Visit: Admitting: Podiatry

## 2025-02-07 ENCOUNTER — Ambulatory Visit: Admitting: Podiatry

## 2025-05-09 ENCOUNTER — Ambulatory Visit: Admitting: Podiatry
